# Patient Record
Sex: Female | Born: 1975 | Race: White | Hispanic: No | Marital: Single
Health system: Midwestern US, Community
[De-identification: ages and names within clinical notes are randomized; demographics above are authoritative.]

## PROBLEM LIST (undated history)

## (undated) DIAGNOSIS — Z1231 Encounter for screening mammogram for malignant neoplasm of breast: Principal | ICD-10-CM

## (undated) DIAGNOSIS — N840 Polyp of corpus uteri: Secondary | ICD-10-CM

## (undated) DIAGNOSIS — Z1239 Encounter for other screening for malignant neoplasm of breast: Secondary | ICD-10-CM

## (undated) MED ORDER — IBUPROFEN 800 MG TAB
800 mg | ORAL_TABLET | Freq: Three times a day (TID) | ORAL | Status: DC | PRN
Start: ? — End: 2017-08-11

## (undated) MED ORDER — DOCUSATE SODIUM 100 MG CAP
100 mg | ORAL_CAPSULE | Freq: Every day | ORAL | Status: AC
Start: ? — End: 2013-06-11

## (undated) MED ORDER — OXYCODONE-ACETAMINOPHEN 5 MG-325 MG TAB
5-325 mg | ORAL_TABLET | Freq: Four times a day (QID) | ORAL | Status: DC | PRN
Start: ? — End: 2017-08-11

---

## 2009-03-05 LAB — METABOLIC PANEL, COMPREHENSIVE
A-G Ratio: 1.4 (ref 0.8–1.7)
ALT (SGPT): 45 U/L (ref 30–65)
AST (SGOT): 19 U/L (ref 15–37)
Albumin: 4.1 g/dL (ref 3.4–5.0)
Alk. phosphatase: 36 U/L — ABNORMAL LOW (ref 50–136)
Anion gap: 7 mmol/L (ref 5–15)
BUN/Creatinine ratio: 20 (ref 12–20)
BUN: 18 MG/DL (ref 7–18)
Bilirubin, total: 0.4 MG/DL (ref 0.1–0.9)
CO2: 29 MMOL/L (ref 21–32)
Calcium: 9.2 MG/DL (ref 8.4–10.4)
Chloride: 107 MMOL/L (ref 100–108)
Creatinine: 0.9 MG/DL (ref 0.6–1.3)
GFR est AA: >60 mL/min/{1.73_m2} (ref >60)
GFR est non-AA: >60 mL/min/{1.73_m2} (ref >60)
Globulin: 3 g/dL (ref 2.0–4.0)
Glucose: 103 MG/DL — ABNORMAL HIGH (ref 74–99)
Potassium: 5 MMOL/L (ref 3.5–5.5)
Protein, total: 7.1 g/dL (ref 6.4–8.2)
Sodium: 143 MMOL/L (ref 136–145)

## 2009-03-05 LAB — LIPID PANEL
CHOL/HDL Ratio: 2.4 (ref 0–5.0)
Cholesterol, total: 181 MG/DL (ref 0–200)
HDL Cholesterol: 75 MG/DL — ABNORMAL HIGH (ref 40–60)
LDL, calculated: 96.6 MG/DL (ref 0–100)
LDL/HDL Ratio: 1.3
Triglyceride: 47 MG/DL (ref 0–150)
VLDL, calculated: 9.4 MG/DL

## 2011-12-23 ENCOUNTER — Inpatient Hospital Stay: Payer: PRIVATE HEALTH INSURANCE

## 2011-12-23 LAB — HCG URINE, QL. - POC: Pregnancy test,urine (POC): NEGATIVE

## 2011-12-23 MED ORDER — DOXYCYCLINE HYCLATE 100 MG CAP
100 mg | ORAL_CAPSULE | Freq: Two times a day (BID) | ORAL | Status: AC
Start: 2011-12-23 — End: 2012-01-02

## 2011-12-23 MED ADMIN — famotidine (PEPCID) tablet 20 mg: ORAL | @ 12:00:00 | NDC 68084017211

## 2011-12-23 MED ADMIN — cefoTEtan (CEFOTAN) 1 g in 0.9% sodium chloride (MBP/ADV) 50 mL MBP: INTRAVENOUS | @ 13:00:00 | NDC 63323038510

## 2011-12-23 MED ADMIN — ketorolac (TORADOL) injection 30 mg: INTRAVENOUS | @ 14:00:00 | NDC 00409379501

## 2011-12-23 MED ADMIN — diphenhydrAMINE (BENADRYL) 50 mg/mL injection: INTRAVENOUS | @ 15:00:00 | NDC 63323066401

## 2011-12-23 MED ADMIN — HYDROmorphone (PF) (DILAUDID) injection 0.2 mg: INTRAVENOUS | @ 14:00:00 | NDC 00409335601

## 2011-12-23 MED ADMIN — metroNIDAZOLE (FLAGYL) IVPB premix 500 mg: INTRAVENOUS | @ 13:00:00 | NDC 00409781124

## 2011-12-23 NOTE — Other (Signed)
TRANSFER - OUT REPORT:    Verbal report given to Vallery Ridge RN on Vickie Martin  being transferred to Phase II for routine post - op       Report consisted of patient???s Situation, Background, Assessment and   Recommendations(SBAR).     Information from the following report(s) SBAR was reviewed with the receiving nurse.    Opportunity for questions and clarification was provided.

## 2011-12-23 NOTE — Op Note (Signed)
[facility]                               [address] [place], [place] [address]                                 OPERATIVE REPORT    PATIENT:     Vickie Martin, Vickie Martin  MRN:            [id number]    DATE:   12/23/2011  BILLING:     [id number]  ROOM:        PACUPACUPL  ATTENDING:   Leta Jungling, MD  DICTATING:   Leta Jungling, MD      SURGEON: Dr. Leta Jungling, MD    ANESTHESIA: General anesthesia.    PREOPERATIVE DIAGNOSES  1. Lower abdominal pain.  2. Dysmenorrhea, and cramping.  3. Thick endometrium with endometrial mass.    POSTOPERATIVE DIAGNOSES  1. Lower abdominal pain.  2. Dysmenorrhea, and cramping.  3. Thick endometrium with endometrial mass.  4. Endometrial polyp and cyst.  5. Submucosal fibroid.    PROCEDURE PERFORMED  1. Hysteroscopy.  2. Hysteroscopic myomectomy.  3. Endometrial polypectomy.  4. Fractional dilatation and curettage.    COMPLICATIONS: None.    BLOOD LOSS: Minimum.    OPERATIVE FINDINGS: At the time of surgery, examination under anesthesia  revealed normal vulva and vagina. Cervix is closed. Uterus normal in size.  Adnexa felt to be clear. The uterus sounded to a depth of 7.5 cm.    Hysteroscopic evaluation of the uterus revealed endometrial polyps seen in  the lower uterine segment and submucosal fibroid seen in the midportion of  the uterus. The fundus appeared to be normal. Both tubal ostia were seen,  appeared to be normal. More appeared to be more patent on the left side.  A slightly thickened endometrium was noted. The cavity itself appeared to  be regular, apart from the fibroid, with no evidence of anomalies.    DESCRIPTION OF PROCEDURE: Under effective general anesthesia, the patient  in the lithotomy position, she was prepped and draped in appropriate  fashion. Examination under anesthesia revealed the above described  findings. Timeout was called and we agreed with the planned procedure. The   patient received preoperative antibiotics. The posterior vaginal wall was  retracted with a right-angle retractor. The cervix was grasped anteriorly  with single-tooth tenaculum. Endocervical curettage was performed after the  cervix was dilated to Hegar 6 dilator. Using 2-3 mm flexible hysteroscope  with lactated Ringer's for distention, evaluation of the endometrial cavity  revealed the above-described findings. The hysteroscope was removed. The  polyp was removed. The cavity was systematically curetted until felt to be  clear. The fibroid was felt and was dislodged. We did repeated  hysteroscopic evaluation to locate the fibroid and then to retrieve it,  which was done uneventfully.    At this point, evaluation and the curettage revealed that the endometrial  cavity is now clear and regular. The instruments were then removed from the  vagina. Tenaculum site was hemostatic. The cervix was not traumatized. The  vagina was then irrigated with hydrogen peroxide. The vulva was cleaned  with sterile water.    The patient tolerated the  procedure very well, was taken to recovery room  in good condition. Sponge, needle, and instrument counts were correct.  Estimated blood loss from procedure is 10 mL. No complications were  encountered with surgery or with anesthesia.                                Leta Jungling, MD    BA:wmx  D: 12/23/2011 T: 12/23/2011  9:18 A  Job: [id number]  CScriptDoc #: [id number]    cc:     Leta Jungling, MD          Kreg Shropshire, MD

## 2011-12-23 NOTE — H&P (Signed)
Preoperative evaluation  Met with patient and partner  H&P was reviewed and no changes  Planned surgery   Discussed with patient  All Their questions were answered  Home meds reviewed as follows:    Prior to Admission medications    Not on File       Visit Vitals   Item Reading   ??? BP 111/76   ??? Pulse 90   ??? Temp 98.1 ??F (36.7 ??C)   ??? Resp 16   ??? Ht 5\' 5"  (1.651 m)   ??? Wt 64.411 kg (142 lb)   ??? BMI 23.63 kg/m2   ??? SpO2 100%

## 2011-12-23 NOTE — Brief Op Note (Signed)
BRIEF OPERATIVE NOTE    Date of Procedure: 12/23/2011   Preoperative Diagnosis: ENDOMETRIAL POLYP  Postoperative Diagnosis: ENDOMETRIAL POLYP. submucosal fibroid   Procedure: Procedure(s) with comments:  DILATATION AND CURETTAGE HYSTEROSCOPY - DILATATION AND CURETTAGE HYSTEROSCOPY   Hysteroscopic myomectomy. Endometrial polypectomy  Surgeon(s) and Role:     * Leta Jungling, MD - Primary  Anesthesia: General   Estimated Blood Loss: 10 ml  Specimens:   ID Type Source Tests Collected by Time Destination   1 : Mucosal Fibroid Preservative Uterus  Leta Jungling, MD 12/23/2011 [phone] Pathology   2 : Uterus EMC Preservative Uterus  Leta Jungling, MD 12/23/2011 0830 Pathology   3 : Uterus EMC Preservative Uterus  Leta Jungling, MD 12/23/2011 0830 Pathology      Findings: uterus sound 7.5 cm. Endometrial polyps and submucosal fibroid   Complications: none  Implants: * No implants in log *

## 2011-12-23 NOTE — Procedures (Signed)
Post-Anesthesia Evaluation & Assessment    Visit Vitals   Item Reading   ??? BP 117/68   ??? Pulse 97   ??? Temp 98.6 ??F (37 ??C)   ??? Resp 14   ??? Ht 5' 5" (1.651 m)   ??? Wt 64.411 kg (142 lb)   ??? BMI 23.63 kg/m2   ??? SpO2 99%       Nausea/Vomiting: no nausea    Post-operative hydration adequate.    Pain score (VAS):     Mental status & Level of consciousness: alert and oriented x 3    Neurological status: moves all extremities, sensation grossly intact    Pulmonary status: airway patent, supplemental oxygen required no. none    Complications related to anesthesia: none    Patient has met all discharge requirements.    Additional comments: none    Vickie Ismael H Marsha Gundlach, MD  December 23, 2011

## 2011-12-23 NOTE — Op Note (Signed)
[facility]                               [address] [place], [place] [address]                                 OPERATIVE REPORT    PATIENT:     Vickie Martin, Vickie Martin  MRN:            [id number]    DATE:   12/23/2011  BILLING:     [id number]  ROOM:        PACUPACUPL  ATTENDING:   Leta Jungling, MD  DICTATING:   Leta Jungling, MD      SURGEON: Dr. Leta Jungling, MD    ANESTHESIA: General anesthesia.    PREOPERATIVE DIAGNOSES  1. Lower abdominal pain.  2. Dysmenorrhea, and cramping.  3. Thick endometrium with endometrial mass.    POSTOPERATIVE DIAGNOSES  1. Lower abdominal pain.  2. Dysmenorrhea, and cramping.  3. Thick endometrium with endometrial mass.  4. Endometrial polyp and cyst.  5. Submucosal fibroid.    PROCEDURE PERFORMED  1. Hysteroscopy.  2. Hysteroscopic myomectomy.  3. Endometrial polypectomy.  4. Fractional dilatation and curettage.    COMPLICATIONS: None.    BLOOD LOSS: Minimum.    OPERATIVE FINDINGS: At the time of surgery, examination under anesthesia  revealed normal vulva and vagina. Cervix is closed. Uterus normal in size.  Adnexa felt to be clear. The uterus sounded to a depth of 7.5 cm.    Hysteroscopic evaluation of the uterus revealed endometrial polyps seen in  the lower uterine segment and submucosal fibroid seen in the midportion of  the uterus. The fundus appeared to be normal. Both tubal ostia were seen,  appeared to be normal. More appeared to be more patent on the left side.  A slightly thickened endometrium was noted. The cavity itself appeared to  be regular, apart from the fibroid, with no evidence of anomalies.    DESCRIPTION OF PROCEDURE: Under effective general anesthesia, the patient  in the lithotomy position, she was prepped and draped in appropriate  fashion. Examination under anesthesia revealed the above described  findings. Timeout was called and we agreed with the planned procedure. The  patient received  preoperative antibiotics. The posterior vaginal wall was  retracted with a right-angle retractor. The cervix was grasped anteriorly  with single-tooth tenaculum. Endocervical curettage was performed after the  cervix was dilated to Hegar 6 dilator. Using 2-3 mm flexible hysteroscope  with lactated Ringer's for distention, evaluation of the endometrial cavity  revealed the above-described findings. The hysteroscope was removed. The  polyp was removed. The cavity was systematically curetted until felt to be  clear. The fibroid was felt and was dislodged. We did repeated  hysteroscopic evaluation to locate the fibroid and then to retrieve it,  which was done uneventfully.    At this point, evaluation and the curettage revealed that the endometrial  cavity is now clear and regular. The instruments were then removed from the  vagina. Tenaculum site was hemostatic. The cervix was not traumatized. The  vagina was then irrigated with hydrogen peroxide. The vulva was cleaned  with sterile water.    The patient tolerated the  procedure very well, was taken to recovery room  in good condition. Sponge, needle, and instrument counts were correct.  Estimated blood loss from procedure is 10 mL. No complications were  encountered with surgery or with anesthesia.                                Leta Jungling, MD    BA:wmx  D: 12/23/2011 T: 12/23/2011  9:18 A  Job: [id number]  CScriptDoc #: [id number]    cc:     Leta Jungling, MD          Kreg Shropshire, MD

## 2011-12-23 NOTE — Procedures (Signed)
Post-Anesthesia Evaluation & Assessment    Visit Vitals   Item Reading   ??? BP 117/68   ??? Pulse 97   ??? Temp 98.6 ??F (37 ??C)   ??? Resp 14   ??? Ht 5\' 5"  (1.651 m)   ??? Wt 64.411 kg (142 lb)   ??? BMI 23.63 kg/m2   ??? SpO2 99%       Nausea/Vomiting: no nausea    Post-operative hydration adequate.    Pain score (VAS):     Mental status & Level of consciousness: alert and oriented x 3    Neurological status: moves all extremities, sensation grossly intact    Pulmonary status: airway patent, supplemental oxygen required no. none    Complications related to anesthesia: none    Patient has met all discharge requirements.    Additional comments: none    Pola Corn, MD  December 23, 2011

## 2012-09-28 LAB — HEP B SURFACE AG: HBsAg, External: NEGATIVE

## 2012-09-28 LAB — HIV-1 AB WESTERN BLOT CONFIRM ONLY

## 2012-09-28 LAB — RUBELLA AB, IGM: Rubella, External: IMMUNE

## 2012-09-28 LAB — BLOOD TYPE, (ABO+RH)
ABO,Rh: O POS
TYPE, ABO & RH, EXTERNAL: O POS

## 2012-09-28 LAB — RPR

## 2013-02-08 LAB — GYN RAPID GP B STREP: GrBStrep, External: NEGATIVE

## 2013-02-08 LAB — CHLAMYDIA DNA PROBE: Chlamydia, External: NEGATIVE

## 2013-02-08 LAB — N GONORRHOEAE, DNA PROBE: Gonorrhea, External: NEGATIVE

## 2013-03-11 ENCOUNTER — Inpatient Hospital Stay
Admit: 2013-03-11 | Discharge: 2013-03-13 | Disposition: A | Discharge status: Home / Self Care | Payer: PRIVATE HEALTH INSURANCE | Attending: Obstetrics & Gynecology | Admitting: Obstetrics & Gynecology

## 2013-03-11 LAB — CBC WITH AUTOMATED DIFF
ABS. BASOPHILS: 0 10*3/uL (ref 0.0–0.06)
ABS. EOSINOPHILS: 0 10*3/uL (ref 0.0–0.4)
ABS. LYMPHOCYTES: 1.6 10*3/uL (ref 0.9–3.6)
ABS. MONOCYTES: 0.8 10*3/uL (ref 0.05–1.2)
ABS. NEUTROPHILS: 12.8 10*3/uL — ABNORMAL HIGH (ref 1.8–8.0)
BASOPHILS: 0 % (ref 0–2)
EOSINOPHILS: 0 % (ref 0–5)
HCT: 35 % (ref 35.0–45.0)
HGB: 12 g/dL (ref 12.0–16.0)
LYMPHOCYTES: 10 % — ABNORMAL LOW (ref 21–52)
MCH: 31 PG (ref 24.0–34.0)
MCHC: 34.3 g/dL (ref 31.0–37.0)
MCV: 90.4 FL (ref 74.0–97.0)
MONOCYTES: 5 % (ref 3–10)
MPV: 10.4 FL (ref 9.2–11.8)
NEUTROPHILS: 85 % — ABNORMAL HIGH (ref 40–73)
PLATELET: 195 10*3/uL (ref 135–420)
RBC: 3.87 M/uL — ABNORMAL LOW (ref 4.20–5.30)
RDW: 14 % (ref 11.6–14.5)
WBC: 15.2 10*3/uL — ABNORMAL HIGH (ref 4.6–13.2)

## 2013-03-11 LAB — TYPE & SCREEN
ABO/Rh(D): O POS
Antibody screen: NEGATIVE

## 2013-03-11 LAB — TYPE AND SCREEN
ABO/Rh: O POS
Antibody Screen: NEGATIVE

## 2013-03-11 MED ADMIN — ondansetron (ZOFRAN) 4 mg/2 mL injection: @ 17:00:00 | NDC 00781301072

## 2013-03-11 MED ADMIN — lactated ringers bolus infusion 500 mL: INTRAVENOUS | @ 17:00:00 | NDC 00409795309

## 2013-03-11 MED ADMIN — oxytocin (PITOCIN) 20 units/1000 ml LR: INTRAVENOUS | @ 22:00:00 | NDC 61553076904

## 2013-03-11 MED ADMIN — lactated ringers infusion: INTRAVENOUS | @ 19:00:00 | NDC 00409795309

## 2013-03-11 MED ADMIN — ibuprofen (MOTRIN) 400 mg tablet: ORAL | @ 23:00:00 | NDC 00904585361

## 2013-03-11 MED ADMIN — butorphanol (STADOL) 1 mg/mL injection: INTRAVENOUS | @ 17:00:00 | NDC 00409162301

## 2013-03-11 NOTE — Progress Notes (Signed)
Unable to get temp, pt ate ice. Skin warm to touch.

## 2013-03-11 NOTE — Progress Notes (Signed)
Discussed vacuum

## 2013-03-11 NOTE — Progress Notes (Signed)
Pushing continues. Alnaif bedside.

## 2013-03-11 NOTE — H&P (Signed)
L&D note  Seen in office, had SROM at 8 am with thick meconium stained fluid, pea soup like  37 year old G1 at 39 weeks  EDC 03-15-13  BP 118/75   Pulse 80   Temp(Src) 98.2 ??F (36.8 ??C)   Resp 18   Ht 5\' 5"  (1.651 m)   Wt 79.379 kg (175 lb)   BMI 29.12 kg/m2  Uterus term size  Vertex, confirmed by [place] re very thick MSAF  SFH 37 cm  NST reactive  VE  cx 90% effaced  2 cm dilated  Vertex at -1  No membranes  +++ thick meconium stained amniotic fluid with particulate  patient is contracting every 4-5 minutes  Impresson SROM with thick MSAF  Plan admit to L&D  expectant management  Pit PRN  Epidural PRN

## 2013-03-11 NOTE — Progress Notes (Signed)
BF L 15 min. R 45 min. Emycin. Pitocin bolus infused.

## 2013-03-11 NOTE — Progress Notes (Signed)
Vac applied 45sec

## 2013-03-11 NOTE — Progress Notes (Signed)
Assumed care of 36YO G1P0 in for SROM. Laboring with ext monitors. LR bolus infusing to 20gauge catheter in L hand.  SVE by Griffith Citron, RN. 8-9cm.

## 2013-03-11 NOTE — Progress Notes (Signed)
Uncomfortable in perineum. No pain rating.

## 2013-03-11 NOTE — Other (Addendum)
37 year old G1 P0 admitted with SROM and thick meconium  Dr. Norlene Campbell notified and orders received                                                gG

## 2013-03-11 NOTE — Progress Notes (Addendum)
Repair in progress. 5ml mec stained fluid removed by delee

## 2013-03-11 NOTE — Progress Notes (Signed)
Newborn to nursery.

## 2013-03-11 NOTE — Progress Notes (Signed)
alnaif bedside

## 2013-03-11 NOTE — Other (Signed)
Patient is requesting pain medication.

## 2013-03-11 NOTE — Progress Notes (Signed)
alnaif ordered PIT

## 2013-03-11 NOTE — Other (Signed)
Patient ambulating in hallway

## 2013-03-11 NOTE — Progress Notes (Signed)
Maternal rate tracing by palpation.

## 2013-03-11 NOTE — Procedures (Signed)
Delivery Note    Obstetrician:  Bartley Vuolo, MD    Pre-Delivery Diagnosis: Term pregnancy, Spontaneous labor, SROM of Thick MSAF    Post-Delivery Diagnosis: Living newborn infant( Female)    GBS Status: Negative    Intrapartum Event: None    Procedure: Vacuum assisted delivery    Epidural: NO    Monitor:  Fetal Heart Tones - External and Uterine Contractions - External    Indications for instrumental delivery: prolonged second stage with failure to progress    Estimated Blood Loss:     Episiotomy: 3rd degree    Laceration(s):  3rd degree    Laceration(s) repair: YES    Presentation: Cephalic    Fetal Description: singleton    Comments:  2 pulls on KIWI vacuum with no POP Offs, position was ROA, at +2, fully dilated cervix    Fetal Position: Right Occiput Anterior    Birth Weight: 3420 (7,9 Lb)    Birth Length: pending    Apgar - One Minute: 9    Apgar - Five Minutes: 9    Umbilical Cord: 3 vessels present    Specimens: none           Complications:  none           Cord Blood Results:   Information for the patient's newborn:  Perrell, Baby Boy A [[id number]]     No results found for this basename: PCTABR, PCTDIG, BILI     Prenatal Labs:     Lab Results   Component Value Date/Time    ABO/Rh(D) O POS 03/11/2013  1:02 PM        Attending Attestation: I performed the procedure    Signed By:  Taneisha Fuson, MD     March 11, 2013

## 2013-03-11 NOTE — Progress Notes (Signed)
Pushing

## 2013-03-11 NOTE — Progress Notes (Signed)
Progress  Feels rectal pressure  NST is OK  FH in 125s  VE cx is a small rim, mainly anterior  vertex at +1/+2  No membranes  patient is without epidural or pit  Coping well received stadol earlier  Plan expectant management

## 2013-03-11 NOTE — Procedures (Signed)
Delivery Note    Obstetrician:  Leta Jungling, MD    Pre-Delivery Diagnosis: Term pregnancy, Spontaneous labor, SROM of Thick MSAF    Post-Delivery Diagnosis: Living newborn infant( Female)    GBS Status: Negative    Intrapartum Event: None    Procedure: Vacuum assisted delivery    Epidural: NO    Monitor:  Fetal Heart Tones - External and Uterine Contractions - External    Indications for instrumental delivery: prolonged second stage with failure to progress    Estimated Blood Loss:     Episiotomy: 3rd degree    Laceration(s):  3rd degree    Laceration(s) repair: YES    Presentation: Cephalic    Fetal Description: singleton    Comments:  2 pulls on KIWI vacuum with no POP Offs, position was ROA, at +2, fully dilated cervix    Fetal Position: Right Occiput Anterior    Birth Weight: 3420 (7,9 Lb)    Birth Length: pending    Apgar - One Minute: 9    Apgar - Five Minutes: 9    Umbilical Cord: 3 vessels present    Specimens: none           Complications:  none           Cord Blood Results:   Information for the patient's newborn:  Kandie, Keiper Boy A [[id number]]     No results found for this basename: PCTABR, PCTDIG, BILI     Prenatal Labs:     Lab Results   Component Value Date/Time    ABO/Rh(D) O POS 03/11/2013  1:02 PM        Attending Attestation: I performed the procedure    Signed By:  Leta Jungling, MD     March 11, 2013

## 2013-03-12 LAB — HEMATOCRIT: HCT: 28.4 % — ABNORMAL LOW (ref 35.0–45.0)

## 2013-03-12 LAB — HEMOGLOBIN: HGB: 9.7 g/dL — ABNORMAL LOW (ref 12.0–16.0)

## 2013-03-12 MED ADMIN — ibuprofen (MOTRIN) tablet 800 mg: ORAL | @ 12:00:00 | NDC 00904585361

## 2013-03-12 MED ADMIN — oxyCODONE-acetaminophen (PERCOCET) 5-325 mg per tablet 1-2 Tab: ORAL | @ 20:00:00 | NDC 68084035511

## 2013-03-12 MED ADMIN — oxyCODONE-acetaminophen (PERCOCET) 5-325 mg per tablet 1-2 Tab: ORAL | @ 16:00:00 | NDC 68084035511

## 2013-03-12 MED ADMIN — oxyCODONE-acetaminophen (PERCOCET) 5-325 mg per tablet 2 Tab: ORAL | @ 04:00:00 | NDC 68084035511

## 2013-03-12 NOTE — Other (Signed)
Bedside and Verbal shift change report given to Lita, RN (oncoming nurse) by Trisha, RN (offgoing nurse).  Report given with SBAR, Kardex and MAR.

## 2013-03-12 NOTE — Progress Notes (Signed)
Post-Partum Day Number 1 Progress Note    Patient doing well post-partum without significant complaint.  Voiding withour difficulty, normal lochia.    Postpartum Day 1 Vaginal Delivery    The patient feels well. The patient denies emotional concerns. Pain is well controlled with current medications. The baby is good, had mild hypoglycemia. Baby is feeding via breast. Urinary output is adequate. The patient is ambulating well. The patient {IS  tolerating a normal diet. She has not had a bowel movement.      Breastfeeding: Yes normal nipples    Vitals:  Patient Vitals for the past 8 hrs:   BP Temp Pulse Resp SpO2   03/12/13 1550 109/72 mmHg 98.5 ??F (36.9 ??C) 88 18 98 %   03/12/13 1129 114/73 mmHg 97.3 ??F (36.3 ??C) 80 18 98 %     Temp (24hrs), Avg:97.6 ??F (36.4 ??C), Min:97.1 ??F (36.2 ??C), Max:98.5 ??F (36.9 ??C)      Vital signs stable, afebrile.    Exam:  Patient is in good general condition.  Emotionally: appears to be stable  Pallor: No  Breasts: engorged NO, tender NO, nipple normal   Abdomen: soft, fundus firm at level of umbilicus, nontender  Perineum:laceration clean  lochia slight noted.  Lower extremities are negative for swelling, cords or tenderness.    Lab/Data Review:  All lab results for the last 24 hours reviewed.  Recent Results (from the past 12 hour(s))   HEMATOCRIT    Collection Time     03/12/13  6:13 AM       Result Value Range    HCT 28.4 (*) 35.0 - 45.0 %   HEMOGLOBIN    Collection Time     03/12/13  6:13 AM       Result Value Range    HGB 9.7 (*) 12.0 - 16.0 g/dL         Assessment and Plan:  Patient appears to be having uncomplicated post-partum course.  Continue routine perineal care and maternal education.  Plan discharge tomorrow if no problems occur.    Education:  Post partum instructions discussed  Post-Partum Exercise discussed   Breastfeeding discussed  Perineal Care discussed  Circumcision care discussed, handout given   impression PP # 1   Anemia  Plan Fe  supplement

## 2013-03-12 NOTE — Progress Notes (Signed)
Chaplain conducted an initial consultation and Spiritual Assessment for Vickie Martin, who is a 37 y.o.,female. Patient???s Primary Language is: [place].   According to the patient???s EMR Religious Affiliation is: [place].     The reason the Patient came to the hospital is: There are no active problems to display for this patient.       The Chaplain provided the following Interventions:  Initiated a relationship of care and support.   Explored issues of faith, belief, spirituality and religious/ritual needs while hospitalized.  Listened empathically.  Provided chaplaincy education.  Provided information about Spiritual Care Services.  Offered prayer and assurance of continued prayers on patient's behalf.   Chart reviewed.    The following outcomes were achieved:  Patient shared limited information about both their medical narrative and spiritual journey/beliefs.  Patient processed feeling about current hospitalization.  Patient expressed gratitude for chaplain's visit.    Assessment:  Patient does not have any religious/cultural needs that will affect patient???s preferences in health care.  There are no further spiritual or religious issues which require intervention at this time.     Plan:  Chaplains will continue to follow and will provide pastoral care on an as needed/requested basis.  Chaplain recommends bedside caregivers page chaplain on duty if patient shows signs of acute spiritual or emotional distress.      Loni Beckwith Book   Chaplain Resident  Spiritual Care Department  [phone]

## 2013-03-12 NOTE — Other (Signed)
Bedside and Verbal shift change report given to S. Amyre Segundo, RN (oncoming nurse) by L. Warren, RN (offgoing nurse).  Report given with SBAR, MAR and Recent Results.

## 2013-03-13 MED ADMIN — oxyCODONE-acetaminophen (PERCOCET) 5-325 mg per tablet 1-2 Tab: ORAL | @ 01:00:00 | NDC 68084035511

## 2013-03-13 MED ADMIN — ibuprofen (MOTRIN) tablet 800 mg: ORAL | @ 13:00:00 | NDC 00904585361

## 2013-03-13 MED ADMIN — ibuprofen (MOTRIN) tablet 800 mg: ORAL | @ 01:00:00 | NDC 00904585361

## 2013-03-13 MED ADMIN — docusate sodium (COLACE) capsule 100 mg: ORAL | @ 13:00:00 | NDC 62584068311

## 2013-03-13 MED ADMIN — ferrous sulfate tablet 325 mg: ORAL | @ 13:00:00

## 2013-03-13 NOTE — Progress Notes (Signed)
Bedside and Verbal shift change report given to Tracey G RN (oncoming nurse) by Shannon S RN (offgoing nurse).  Report given with SBAR, Kardex and MAR.

## 2013-03-13 NOTE — Progress Notes (Signed)
Dr. Norlene Campbell into see patient.

## 2013-03-13 NOTE — Discharge Summary (Signed)
PPD #2  Patient is feeling well. Good pain control.  Ambulating, voiding and eating well, no nausea or vomiting.  Breast feeding is going very well  On examination  Patient is in good general condition in no apparent distress  Vital signs are stable  Visit Vitals   Item Reading   ??? BP 94/52   ??? Pulse 73   ??? Temp 98 ??F (36.7 ??C)   ??? Resp 18   ??? Ht 5\' 5"  (1.651 m)   ??? Wt 79.379 kg (175 lb)   ??? BMI 29.12 kg/m2   ??? SpO2 96%       CBC   Lab Results   Component Value Date/Time    WBC 15.2* 03/11/2013  1:02 PM    RBC 3.87* 03/11/2013  1:02 PM    HCT 28.4* 03/12/2013  6:13 AM    MCV 90.4 03/11/2013  1:02 PM    MCH 31.0 03/11/2013  1:02 PM    MCHC 34.3 03/11/2013  1:02 PM    RDW 14.0 03/11/2013  1:02 PM        Basic Metabolic Profile   Lab Results   Component Value Date    NA 143 03/04/2009    CO2 29 03/04/2009    BUN 18 03/04/2009         Breast are not engorged, nipples are normal.  Abdomen soft not tender.  Uterus is firm below umbilicus  Perineum is clean  Normal, scant  PV loss  Legs are normal without tenderness, swelling, or redness  Impression  Post op day 2 post Vacuum delivery  Doing well  Plan D/C home.

## 2013-03-13 NOTE — Progress Notes (Signed)
Pt to be d/c home today.

## 2013-03-13 NOTE — Progress Notes (Signed)
I have reviewed discharge instructions with the patient.  The patient verbalized understanding.

## 2014-10-02 LAB — CBC WITH AUTOMATED DIFF
BASOPHILS: 0.2 % (ref 0–3)
EOSINOPHILS: 0.6 % (ref 0–5)
HCT: 34.2 % — ABNORMAL LOW (ref 37.0–50.0)
HGB: 11.5 gm/dl — ABNORMAL LOW (ref 13.0–17.2)
IMMATURE GRANULOCYTES: 0.8 % (ref 0.0–3.0)
LYMPHOCYTES: 20.3 % — ABNORMAL LOW (ref 28–48)
MCH: 30.7 pg (ref 25.4–34.6)
MCHC: 33.6 gm/dl (ref 30.0–36.0)
MCV: 91.2 fL (ref 80.0–98.0)
MONOCYTES: 9.5 % (ref 1–13)
MPV: 9.9 fL (ref 6.0–10.0)
NEUTROPHILS: 68.6 % — ABNORMAL HIGH (ref 34–64)
NRBC: 0 (ref 0–0)
PLATELET: 184 10*3/uL (ref 140–450)
RBC: 3.75 M/uL (ref 3.60–5.20)
RDW-SD: 46.9 — ABNORMAL HIGH (ref 36.4–46.3)
WBC: 8.7 10*3/uL (ref 4.0–11.0)

## 2014-10-02 LAB — ANTIBODY SCREEN: Antibody screen: NEGATIVE

## 2014-10-02 LAB — BLOOD TYPE, (ABO+RH)
ABO/Rh(D): O POS
ABO/Rh: O POS

## 2014-10-03 LAB — POC BLOOD GAS
BASE EXCESS: -5 mmol/L — ABNORMAL LOW (ref <=3)
BICARBONATE: 20.1 mmol/L — ABNORMAL LOW (ref 22–26)
CO2, TOTAL: 21 mmol/L (ref 21–32)
O2 SAT: 67 %
PCO2: 34.5 mm Hg — ABNORMAL LOW (ref 35.0–45.0)
PO2: 35 mm Hg — CL (ref 80–100)
pH: 7.374 (ref 7.350–7.450)

## 2014-10-04 LAB — CBC W/O DIFF
HCT: 34 % — ABNORMAL LOW (ref 37.0–50.0)
HGB: 11.1 gm/dl — ABNORMAL LOW (ref 13.0–17.2)
MCH: 30.5 pg (ref 25.4–34.6)
MCHC: 32.6 gm/dl (ref 30.0–36.0)
MCV: 93.4 fL (ref 80.0–98.0)
MPV: 11 fL — ABNORMAL HIGH (ref 6.0–10.0)
PLATELET: 178 10*3/uL (ref 140–450)
RBC: 3.64 M/uL (ref 3.60–5.20)
RDW-SD: 49.8 — ABNORMAL HIGH (ref 36.4–46.3)
WBC: 14.2 10*3/uL — ABNORMAL HIGH (ref 4.0–11.0)

## 2016-10-17 ENCOUNTER — Encounter

## 2016-11-01 ENCOUNTER — Ambulatory Visit

## 2016-11-01 ENCOUNTER — Inpatient Hospital Stay: Admit: 2016-11-01 | Payer: PRIVATE HEALTH INSURANCE | Attending: Obstetrics & Gynecology | Primary: Internal Medicine

## 2016-11-01 DIAGNOSIS — Z1231 Encounter for screening mammogram for malignant neoplasm of breast: Secondary | ICD-10-CM

## 2017-08-11 ENCOUNTER — Inpatient Hospital Stay
Admit: 2017-08-11 | Discharge: 2017-08-11 | Disposition: A | Discharge status: Home / Self Care | Payer: BLUE CROSS/BLUE SHIELD | Attending: Emergency Medicine

## 2017-08-11 DIAGNOSIS — S91215A Laceration without foreign body of left lesser toe(s) with damage to nail, initial encounter: Secondary | ICD-10-CM

## 2017-08-11 NOTE — ED Provider Notes (Addendum)
EMERGENCY DEPARTMENT HISTORY AND PHYSICAL EXAM    7:19 PM      Date: 08/11/2017  Patient Name: Vickie Martin    History of Presenting Illness     Chief Complaint   Patient presents with   ??? Laceration         History Provided By: Patient    Chief Complaint: toe laceration   Duration:  Hours  Timing:  Acute  Location: left 5th toe   Quality: Burning  Severity: Mild  Modifying Factors:    Associated Symptoms: denies any other associated signs or symptoms      Additional History (Context): Vickie Martin is a 41 y.o. female with No significant past medical history who presents with left 5th toe laceration.  She thinks she kicked it on a rock and it cut her foot.  The toe nail feels loose.  Her last tetanus was within 5 years.  She denies pain.      PCP: Coralie Keens, MD    Current Outpatient Prescriptions   Medication Sig Dispense Refill   ??? levonorgestrel (MIRENA) 20 mcg/24 hr (5 years) IUD 1 Device by IntraUTERine route once.         Past History     Past Medical History:  History reviewed. No pertinent past medical history.    Past Surgical History:  Past Surgical History:   Procedure Laterality Date   ??? HX DILATION AND CURETTAGE     ??? HX OTHER SURGICAL      Breast augmentation   ??? IMPLANT BREAST SILICONE/EQ  2006       Family History:  History reviewed. No pertinent family history.    Social History:  Social History   Substance Use Topics   ??? Smoking status: Never Smoker   ??? Smokeless tobacco: None   ??? Alcohol use None       Allergies:  Allergies   Allergen Reactions   ??? Sulfa (Sulfonamide Antibiotics) Unable to Obtain         Review of Systems       Review of Systems   Constitutional: Negative for fever.   HENT: Negative for facial swelling.    Eyes: Negative for visual disturbance.   Respiratory: Negative for shortness of breath.    Cardiovascular: Negative for chest pain.   Gastrointestinal: Negative for abdominal pain.   Genitourinary: Negative for dysuria.    Musculoskeletal: Negative for neck pain.   Skin: Positive for wound. Negative for rash.   Neurological: Negative for dizziness.   Psychiatric/Behavioral: Negative for confusion.   All other systems reviewed and are negative.        Physical Exam     Visit Vitals   ??? BP 131/80 (BP 1 Location: Left arm, BP Patient Position: At rest)   ??? Pulse 75   ??? Temp 99.1 ??F (37.3 ??C)   ??? Resp 16   ??? Ht 5\' 4"  (1.626 m)   ??? Wt 63.5 kg (140 lb)   ??? LMP 10/19/2014   ??? SpO2 98%   ??? Breastfeeding No   ??? BMI 24.03 kg/m2         Physical Exam   Constitutional: She is oriented to person, place, and time. She appears well-developed and well-nourished. No distress.   HENT:   Head: Normocephalic and atraumatic.   Eyes: Conjunctivae are normal.   Neck: Normal range of motion.   Cardiovascular: Normal rate and regular rhythm.    Pulmonary/Chest: Effort normal.   Abdominal:  She exhibits no distension.   Musculoskeletal: Normal range of motion.   No boney tenderness    Neurological: She is alert and oriented to person, place, and time.   Skin: Skin is warm and dry. She is not diaphoretic.   Left 5th toenail is avulsed, only connected by lateral nail edge.  Small laceration to nailbed.  Superficial skin avulsion distal to the nail plate.     Psychiatric: She has a normal mood and affect.   Nursing note and vitals reviewed.        Diagnostic Study Results     Labs -  No results found for this or any previous visit (from the past 12 hour(s)).    Radiologic Studies -   No orders to display         Medical Decision Making   I am the first provider for this patient.    I reviewed the vital signs, available nursing notes, past medical history, past surgical history, family history and social history.    Vital Signs-Reviewed the patient's vital signs.      Records Reviewed: Nursing Notes (Time of Review: 7:19 PM)    ED Course: Progress Notes, Reevaluation, and Consults:      Provider Notes (Medical Decision Making): MDM   Number of Diagnoses or Management Options  Avulsion of toenail, initial encounter:   Laceration of nail bed of toe, initial encounter:   Diagnosis management comments: nailplate removed to expose a lacerated nailbed.  Wound was cleansed.  Nailbed was repaired with chromics.  Nail was replaced and sutured in position with ethilons.  No pain to suggest fracture so XR was deferred.  Tetanus is UTD>       REMOVAL OF NAIL PLATE  Date/Time: 08/11/2017 7:28 PM  Performed by: Janice Norrie, Jaydan Chretien A  Authorized by: Barton Fanny A     Consent:     Consent obtained:  Verbal    Consent given by:  Patient    Risks discussed:  Bleeding, incomplete removal, infection, pain and permanent nail deformity    Alternatives discussed:  Alternative treatment  Location:     Foot:  L little toe  Pre-procedure details:     Skin preparation:  Betadine  Anesthesia (see MAR for exact dosages):     Anesthesia method:  Nerve block    Block needle gauge:  27 G    Block anesthetic:  Lidocaine 1% w/o epi    Block technique:  Digital     Block injection procedure:  Anatomic landmarks identified, anatomic landmarks palpated, negative aspiration for blood, introduced needle and incremental injection    Block outcome:  Anesthesia achieved  Nail Removal:     Nail removed:  Complete    Nail bed repaired: yes      Nail bed repair material:  5-0 chromic gut    Number of sutures:  2    Removed nail replaced and anchored: yes    Post-procedure details:     Dressing:  4x4 sterile gauze    Patient tolerance of procedure:  Tolerated well, no immediate complications          Diagnosis     Clinical Impression:   1. Avulsion of toenail, initial encounter    2. Laceration of nail bed of toe, initial encounter        Disposition:     Follow-up Information     Follow up With Details Comments Contact Info    Coralie Keens, MD In 2 weeks  For suture removal [address] [facility] [place] [address]  [phone]       [facility]  Immediately if symptoms worsen [address] [place] [address]  [phone]           Patient's Medications   Start Taking    No medications on file   Continue Taking    LEVONORGESTREL (MIRENA) 20 MCG/24 HR (5 YEARS) IUD    1 Device by IntraUTERine route once.   These Medications have changed    No medications on file   Stop Taking    IBUPROFEN (MOTRIN) 800 MG TABLET    Take 1 Tab by mouth three (3) times daily as needed.    OXYCODONE-ACETAMINOPHEN (PERCOCET) 5-325 MG PER TABLET    Take 1 Tab by mouth every six (6) hours as needed.     _______________________________    Attestations:  Scribe Attestation     Kyler Germer A Lovinia Snare, PA-C acting as a Neurosurgeon for and in the presence of [employer], PA-C      August 11, 2017 at 7:56 PM       Provider Attestation:      I personally performed the services described in the documentation, reviewed the documentation, as recorded by the scribe in my presence, and it accurately and completely records my words and actions. August 11, 2017 at 7:56 PM - Barton Fanny, PA-C  _______________________________

## 2017-08-11 NOTE — ED Triage Notes (Signed)
Patient states injury to left 5th toe when she states striking it on a rock.  States tetanus is UTD possibly in last 5 years, definitely in last 10 years.

## 2017-08-11 NOTE — ED Notes (Signed)
I have reviewed discharge instructions with the patient.  The patient verbalized understanding.    Medication teaching given, to include name, dose, action, and side effects. Patient verbalized understanding of medications. Encouraged patient to voice any concerns with reassurance provided.      Patient armband removed and shredded    Patient Discharged in stable condition.

## 2017-10-27 ENCOUNTER — Encounter

## 2017-12-01 ENCOUNTER — Ambulatory Visit

## 2017-12-01 ENCOUNTER — Encounter

## 2017-12-01 ENCOUNTER — Inpatient Hospital Stay: Admit: 2017-12-01 | Payer: BLUE CROSS/BLUE SHIELD | Attending: Obstetrics & Gynecology | Primary: Internal Medicine

## 2017-12-01 DIAGNOSIS — Z1231 Encounter for screening mammogram for malignant neoplasm of breast: Secondary | ICD-10-CM

## 2018-11-30 ENCOUNTER — Encounter

## 2018-12-13 ENCOUNTER — Ambulatory Visit: Payer: PRIVATE HEALTH INSURANCE | Primary: Internal Medicine

## 2021-04-05 ENCOUNTER — Encounter

## 2021-04-23 ENCOUNTER — Inpatient Hospital Stay: Payer: BLUE CROSS/BLUE SHIELD | Attending: Obstetrics & Gynecology | Primary: Internal Medicine

## 2022-01-17 ENCOUNTER — Encounter

## 2022-01-21 ENCOUNTER — Encounter

## 2022-11-13 IMAGING — MR MRI LUMBAR SPINE WITHOUT CONTRAST
4 of 8 series · 15 of 48 positions shown · IV contrast (gadolinium)
Comparison: None

________________________________________________________________________________________________ 
MRI LUMBAR SPINE WITHOUT CONTRAST, 11/13/2022 [DATE]: 
CLINICAL INDICATION: Spondylosis, chronic low back pain extending to legs
TECHNIQUE: Sagittal T1, Sagittal T2, Sagittal STIR, Axial T1 and Axial T2 MR 
images of the lumbar spine were performed without intravenous gadolinium 
enhancement.

[Series 101: survey · axial · 10.0mm · 1.39mm/px · z∈[-15,+199]mm · 3 of 9 slices shown]
[im 1/9]
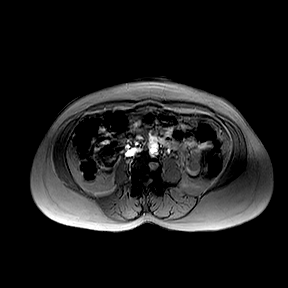
[im 6/9]
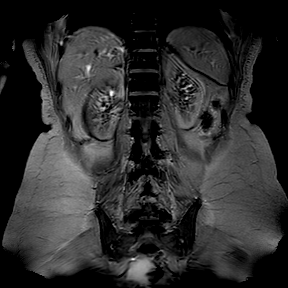
[im 9/9]
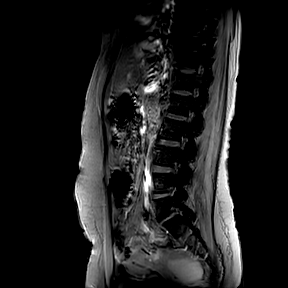

[Series 201: t2w_cor-surv · coronal · 6.0mm · 0.50mm/px · 3 of 7 slices shown]
[im 1/7]
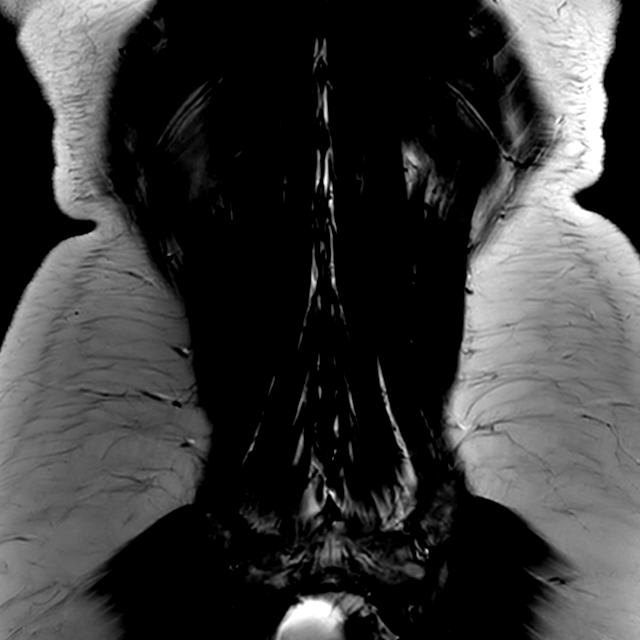
[im 4/7]
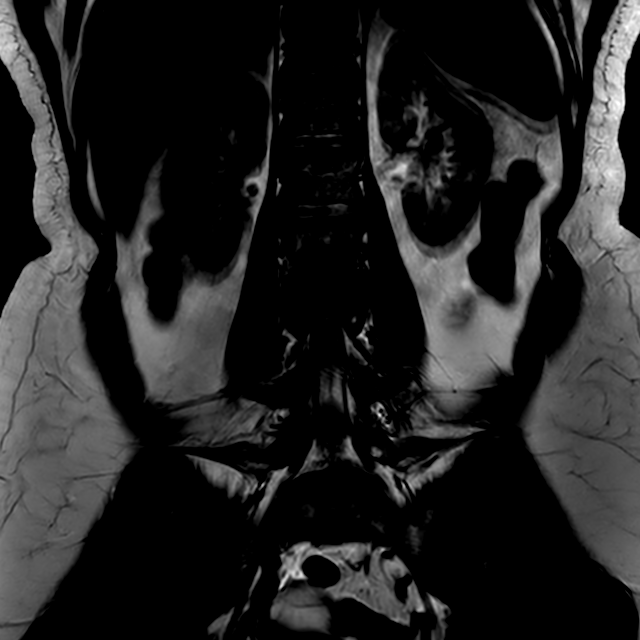
[im 7/7]
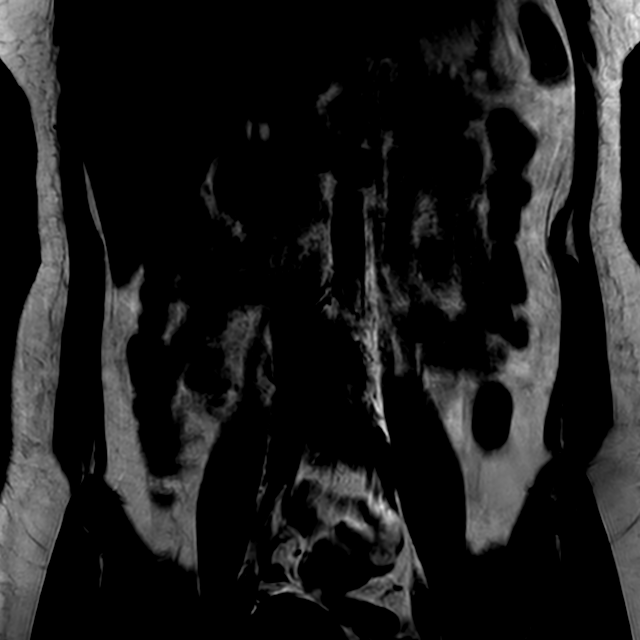

[Series 301: t1w_tse sag · sagittal · 4.0mm · 0.36mm/px · 3 of 17 slices shown]
[im 1/17]
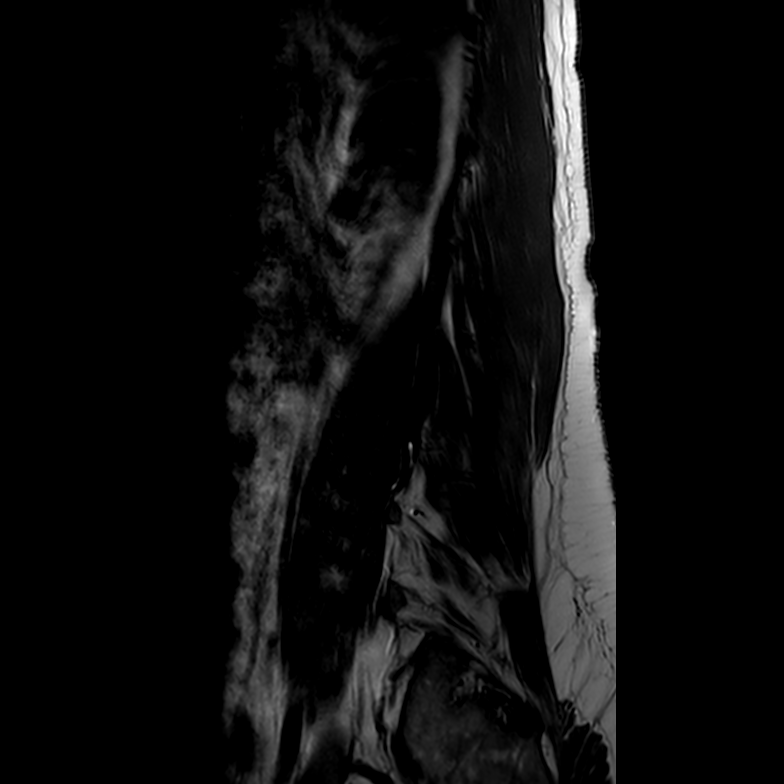
[im 9/17]
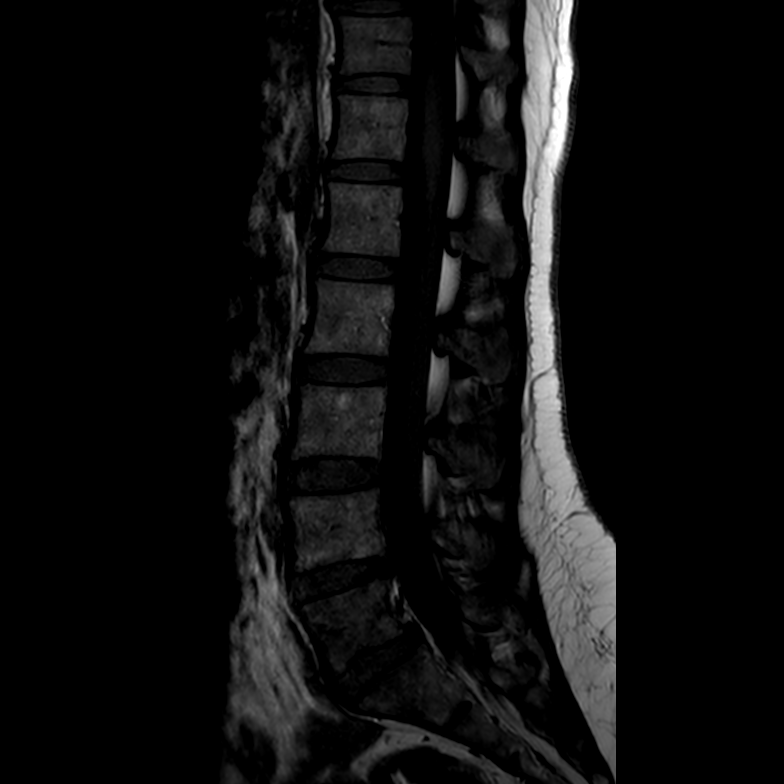
[im 17/17]
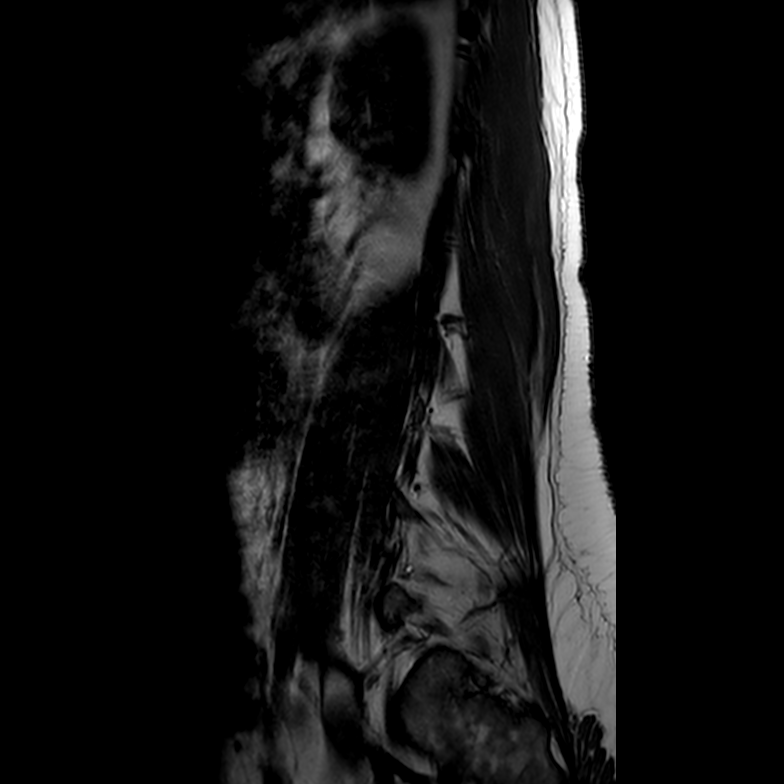

[Series 601: T1 · axial · 5.0mm · 0.35mm/px · z∈[-125,+73]mm · 6 of 38 slices shown]
[im 1/38]
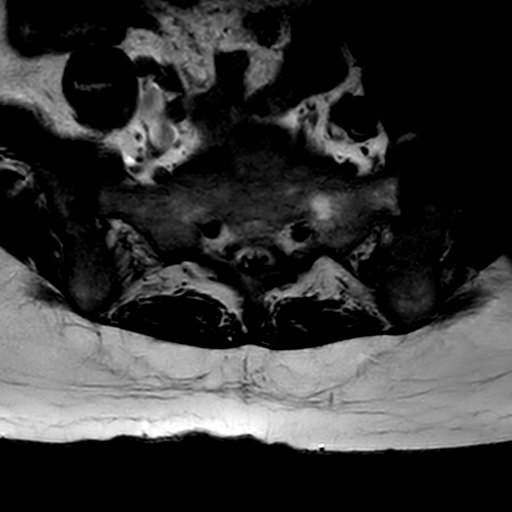
[im 7/38]
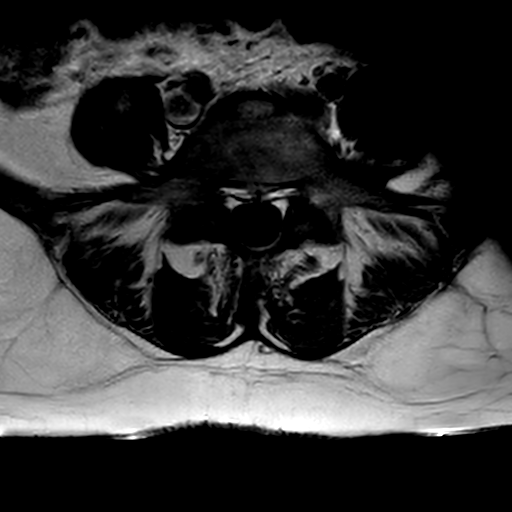
[im 11/38]
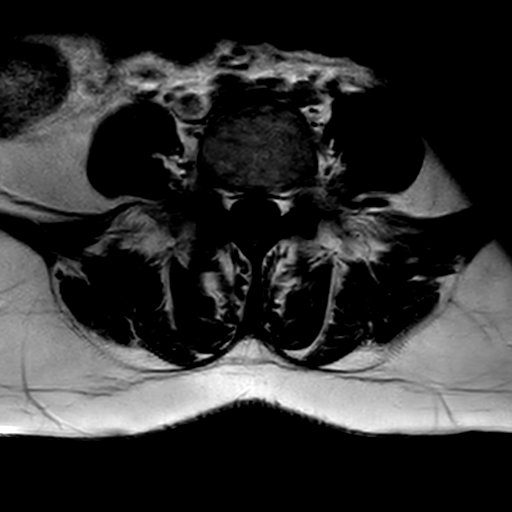
[im 17/38]
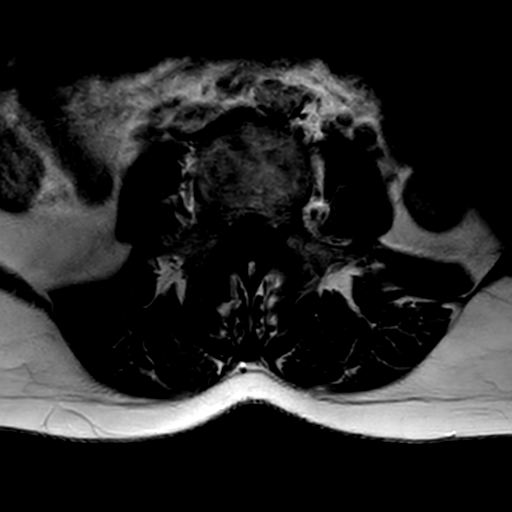
[im 21/38]
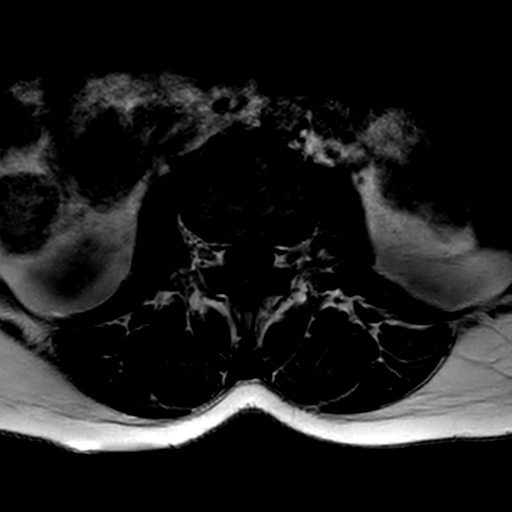
[im 34/38]
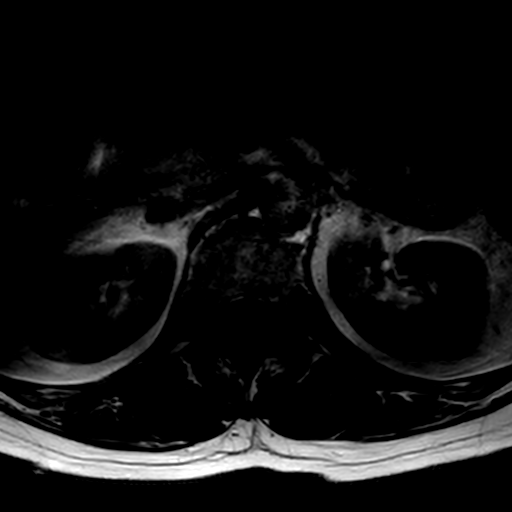

[15 of 48 positions shown; findings below may reference images not displayed]

FINDINGS: Lumbar vertebral heights are intact. There is 3 mm anterolisthesis at 
L5-S1. Mild motion artifact limits detail. Suspect bilateral L5 pars defects. 
At L5-S1 the canal is open. There is mild disc bulge encroaching on the ventral 
epidural fat without deformity of the thecal sac. There is mild bilateral 
foraminal stenosis. 
At L4-5 there is a small midline subligamentous protrusion, axial T2 image 7, 
sagittal T2 image 8. This measures approximately 3 x 2 mm. This produces slight 
deformity on the mid ventral thecal sac, without significant stenosis or neural 
impingement. Foramina are open. Mild facet degenerative change. 
At L3-4 the disc margin is intact. The canal and foramina are open. There are 
mild to moderate facet degenerative changes. 
At L2-3 the disc margin is intact and the canal and foramina are open. Mild 
facet change. 
At L1-2 the disc margin is intact and the canal and foramina are open. 
There is edema within the L5 vertebral segment, likely resulting from reactive 
endplate change at the L5-S1 level. No specific findings to indicate malignancy. 
No compression fracture. Small Schmorls node along the inferior L5 endplate is 
possibly of recent onset.
IMPRESSION: Small midline subligamentous protrusion at L5-S1 extends along the inferior 
Sharpeys fibers, slightly indenting the thecal sac in the midline . This is a 
potential source of discogenic pain. 
3 mm anterolisthesis of L5 on S1. Suspect bilateral L5 pars defects. Motion 
artifact limits detail. There is no significant canal stenosis. Mild bilateral 
foraminal narrowing. 
There is extensive Modic type I change at L5-S1. There is edema involving the 
lower two thirds of the L5 segment. There is a small Schmorls node along the 
inferior L5 endplate, possibly recent. No compression fracture.

## 2022-11-28 IMAGING — CT CT LUMBAR SPINE WITHOUT CONTRAST
2 series · 14 of 20 positions shown, 17 images · non-contrast
Comparison: MRI lumbar spine from November 13, 2022.

________________________________________________________________________________________________ 
******** ADDENDUM #1 ********/n 
3-D reconstructions performed on a dedicated independent workstation. 
CT LUMBAR SPINE WITHOUT CONTRAST, 11/28/2022 [DATE]: 
CLINICAL INDICATION: Chronic low back pain with radiation down bilateral legs. 
Prior MRI showed suspected L5 pars defects. 
A search for DICOM formatted images was conducted for prior CT imaging studies 
completed at a non-affiliated media free facility.
TECHNIQUE: The lumbar spine was scanned from T12 through mid-sacrum without 
contrast on a high-resolution CT scanner using dose reduction techniques.

[Series 3: axial · axial · 0.32mm/px · z∈[-349,-101]mm · 11 of 294 slices shown, 14 images]
[im 23/294  soft-tissue]
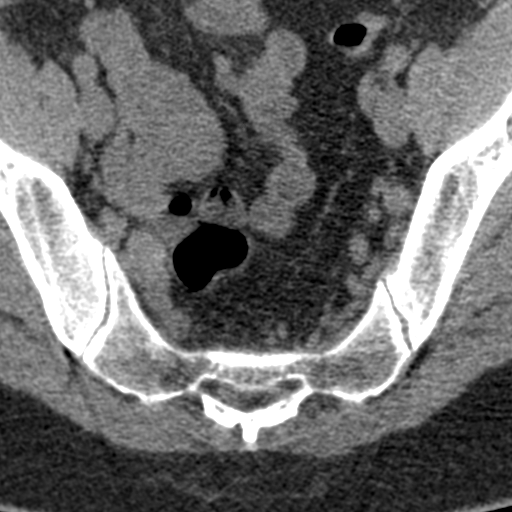
[im 23/294  bone]
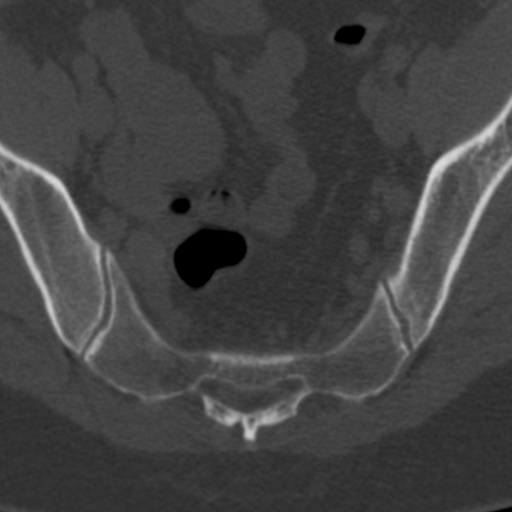
[im 46/294  bone]
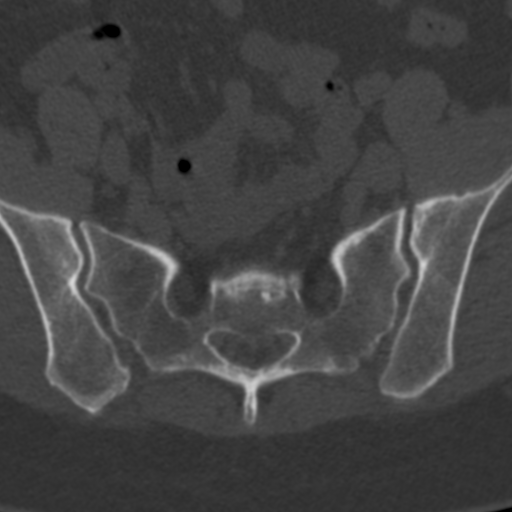
[im 68/294  bone]
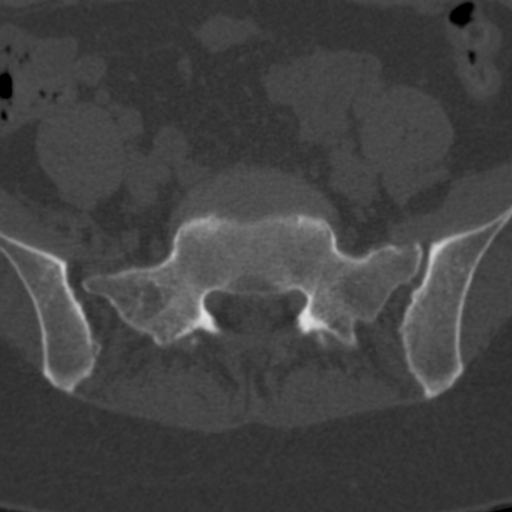
[im 91/294  bone]
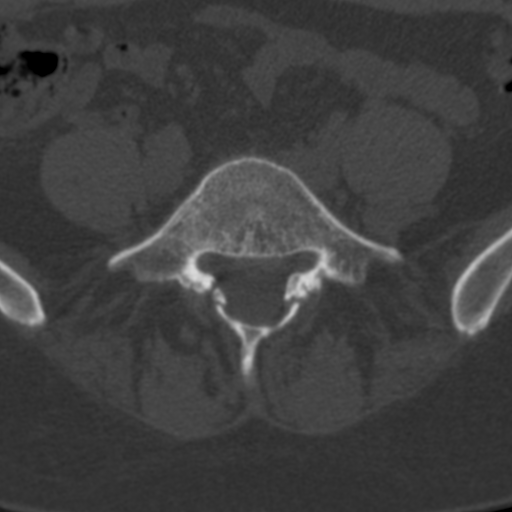
[im 113/294  soft-tissue]
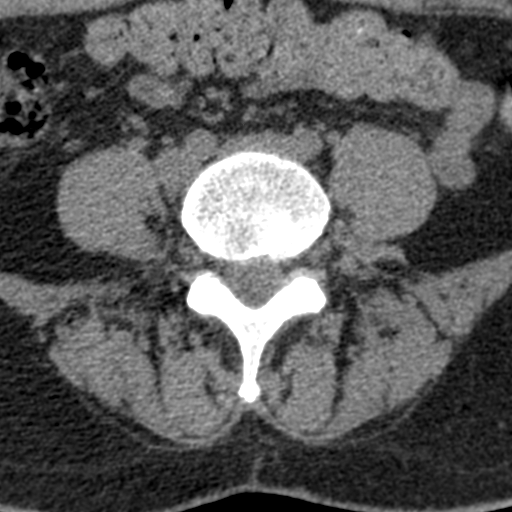
[im 113/294  bone]
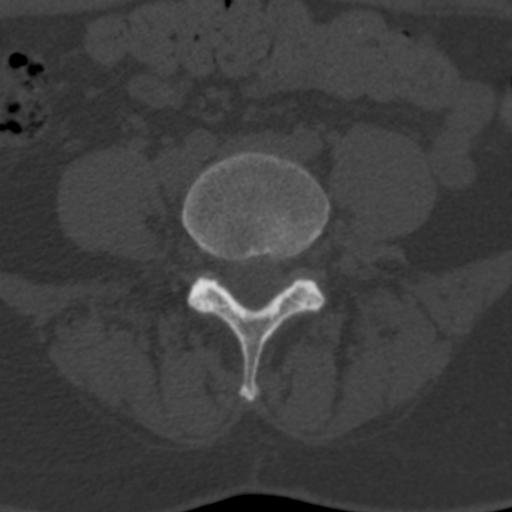
[im 158/294  bone]
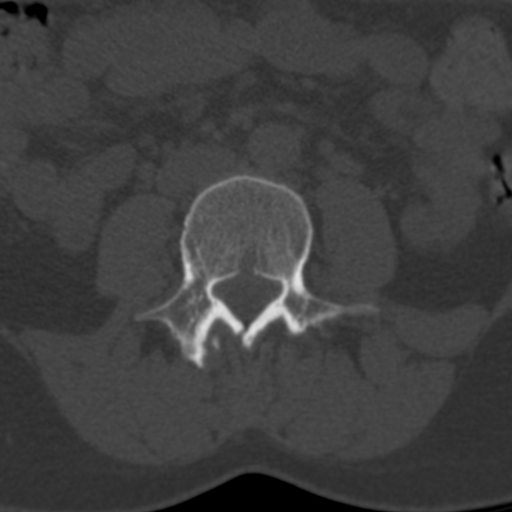
[im 181/294  bone]
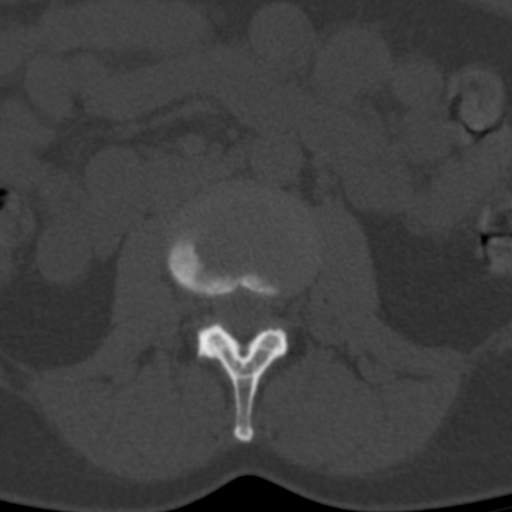
[im 203/294  bone]
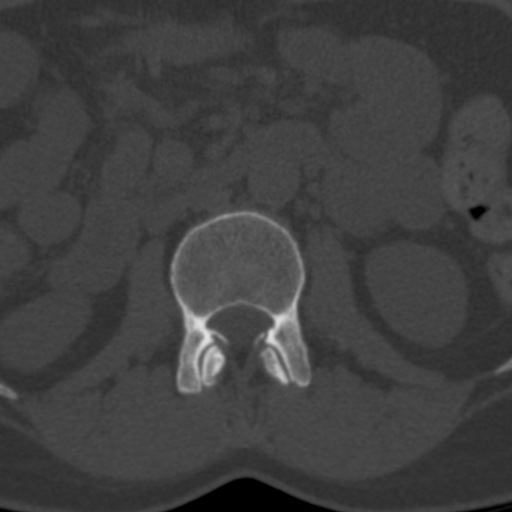
[im 226/294  soft-tissue]
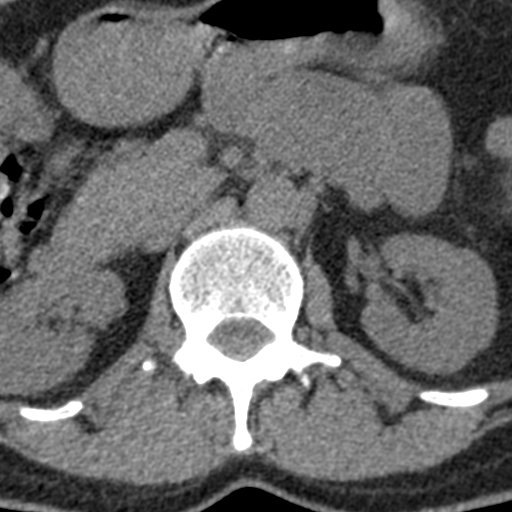
[im 226/294  bone]
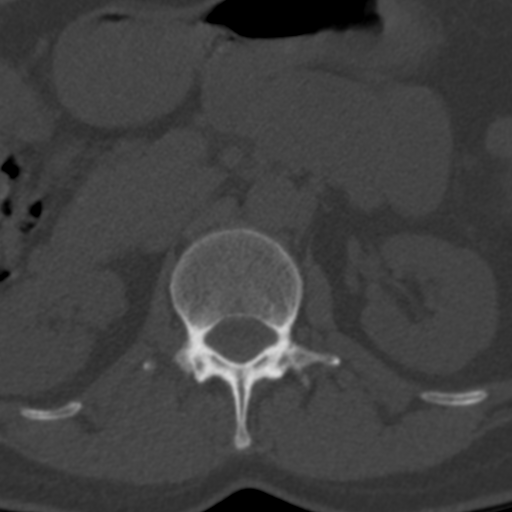
[im 248/294  bone]
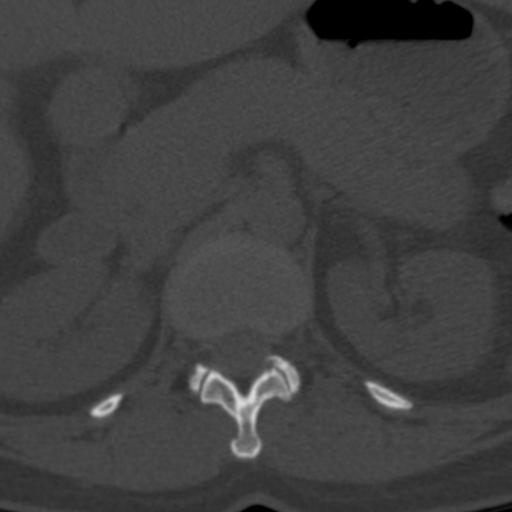
[im 271/294  bone]
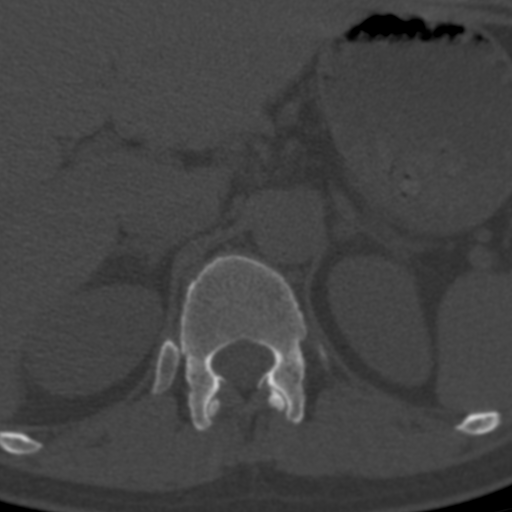

[Series 5: cor · coronal · 0.37mm/px · 3 of 150 slices shown]
[im 30/150  bone]
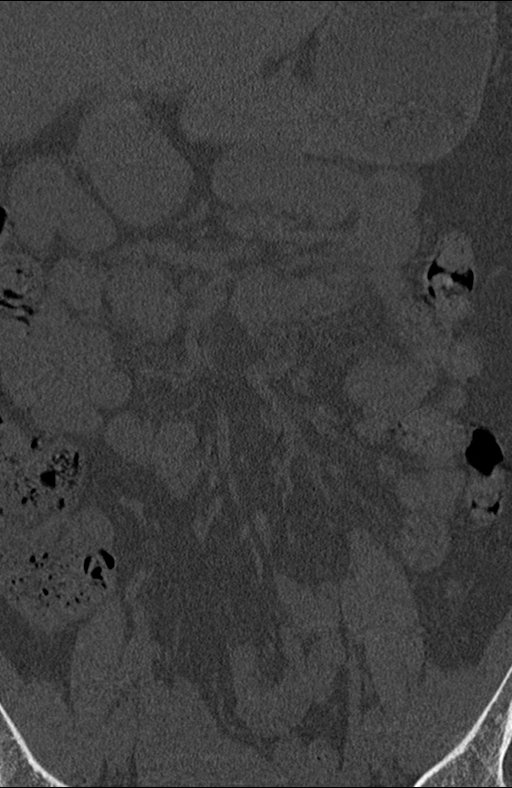
[im 60/150  bone]
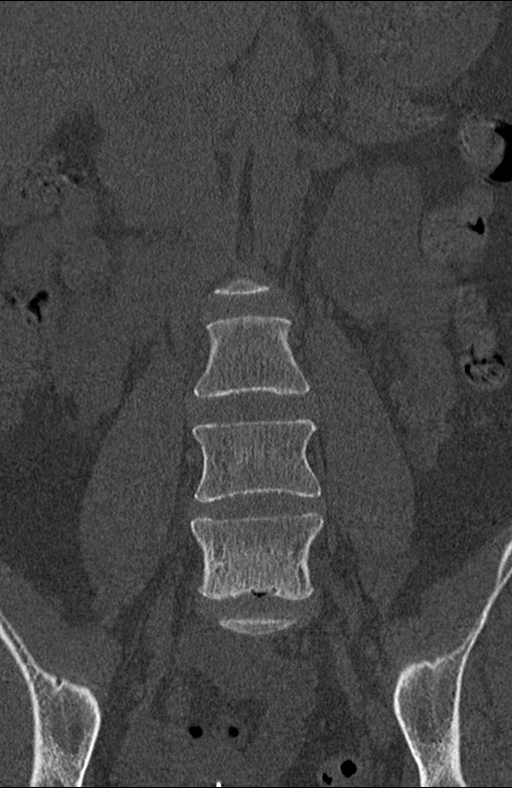
[im 90/150  bone]
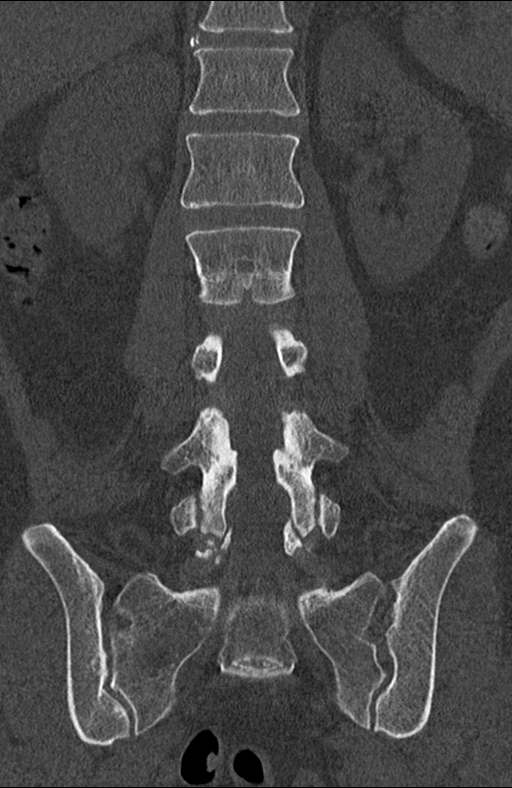

[14 of 20 positions shown; findings below may reference images not displayed]

FINDINGS: -------------------------------------------------------------------------------- 
------ 
Bilateral L5 spondylolysis is present with mild grade 1 anterolisthesis of L5 
relative to S1.  
No change in alignment compared to prior MRI. No focal suspect lytic or blastic 
lesions. Mild vacuum disc phenomenon at L5-S1. Visualized extraspinal soft 
tissues reveal intrauterine device in place. Nonobstructing left renal stones, 
largest in the lower pole measuring 3 mm. 
-------------------------------------------------------------------------------- 
------
IMPRESSION: Bilateral L5 spondylolysis with grade 1 spondylolisthesis. 

RADIATION DOSE REDUCTION: All CT scans are performed using radiation dose 
reduction techniques, when applicable.  Technical factors are evaluated and 
adjusted to ensure appropriate moderation of exposure.  Automated dose 
management technology is applied to adjust the radiation doses to minimize 
exposure while achieving diagnostic quality images.

## 2023-03-22 IMAGING — MG MAMMOGRAPHY SCREENING BILATERAL WITH IMPLANTS
8 of 12 series · 8 of 28 positions shown · non-contrast
Comparison: Comparison was made to prior examinations.

________________________________________________________________________________________________ 
MAMMOGRAPHY SCREENING BILATERAL WITH IMPLANTS, 03/22/2023 [DATE]: 
CLINICAL INDICATION: Encounter for screening mammogram..
TECHNIQUE: Digital mammogram and 3-D Tomosynthesis were obtained. These were 
interpreted both primarily and with the aid of computer-aided detection system. 
Because the patient has breast implants additional Bent views were performed. 
BREAST DENSITY: (Level C) The breasts are heterogeneously dense, which may 
obscure small masses.

[L MLO (1 of 2)]
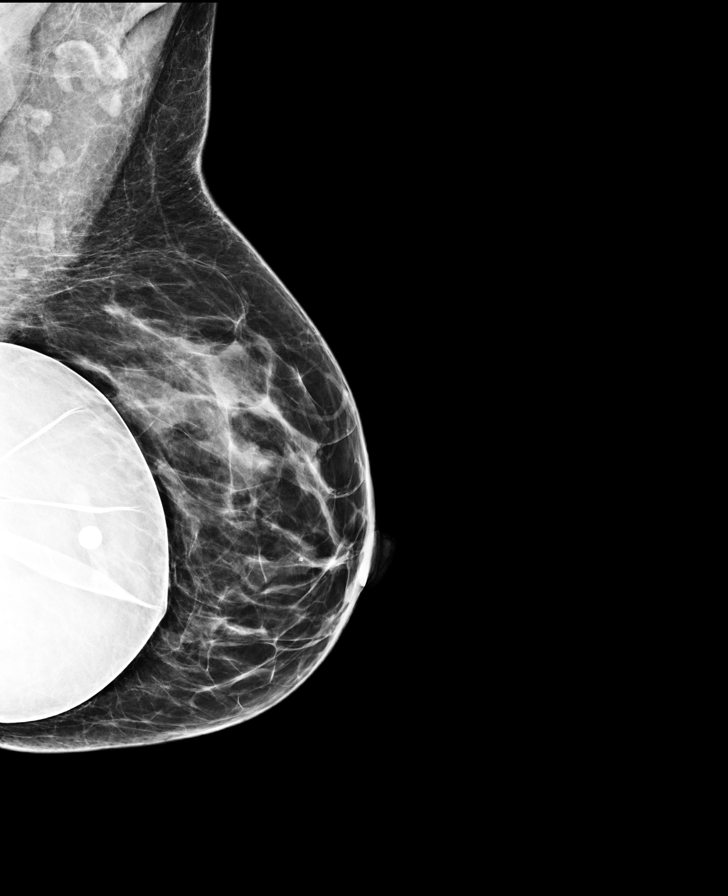

[R MLO (1 of 2)]
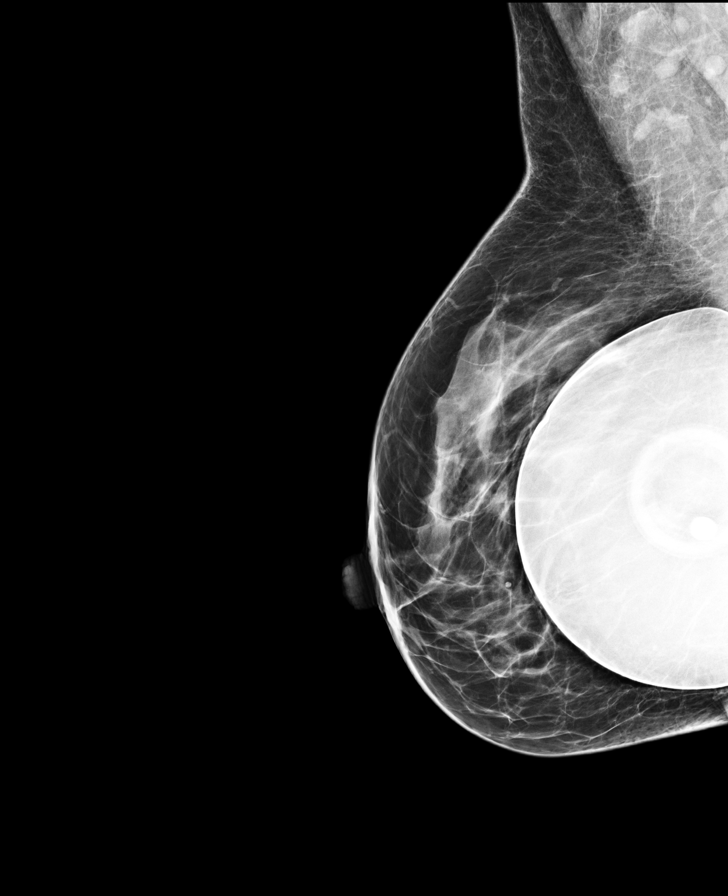

[L CC (1 of 2)]
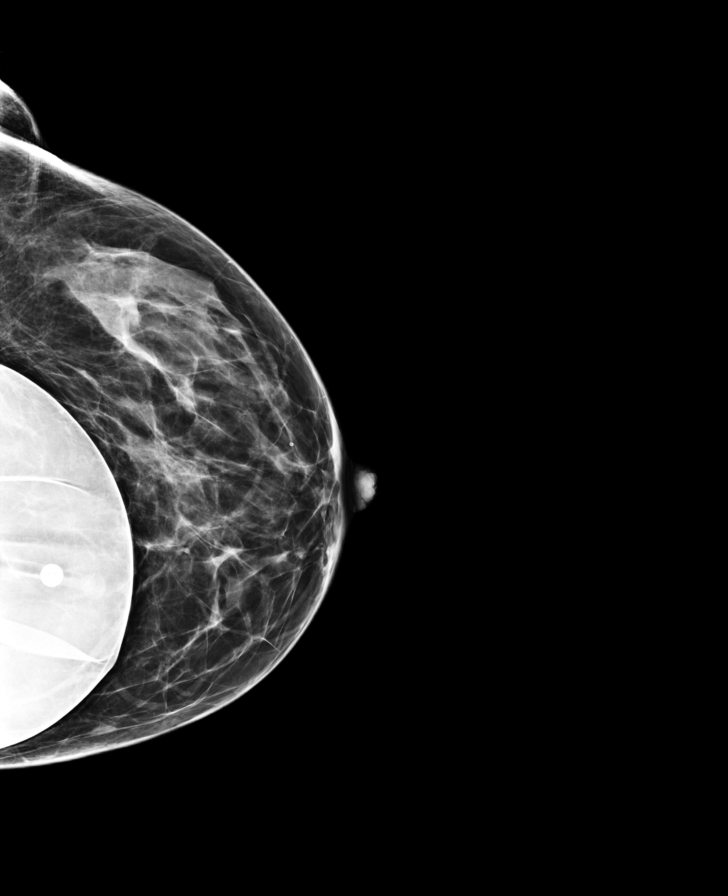

[R CC (1 of 2)]
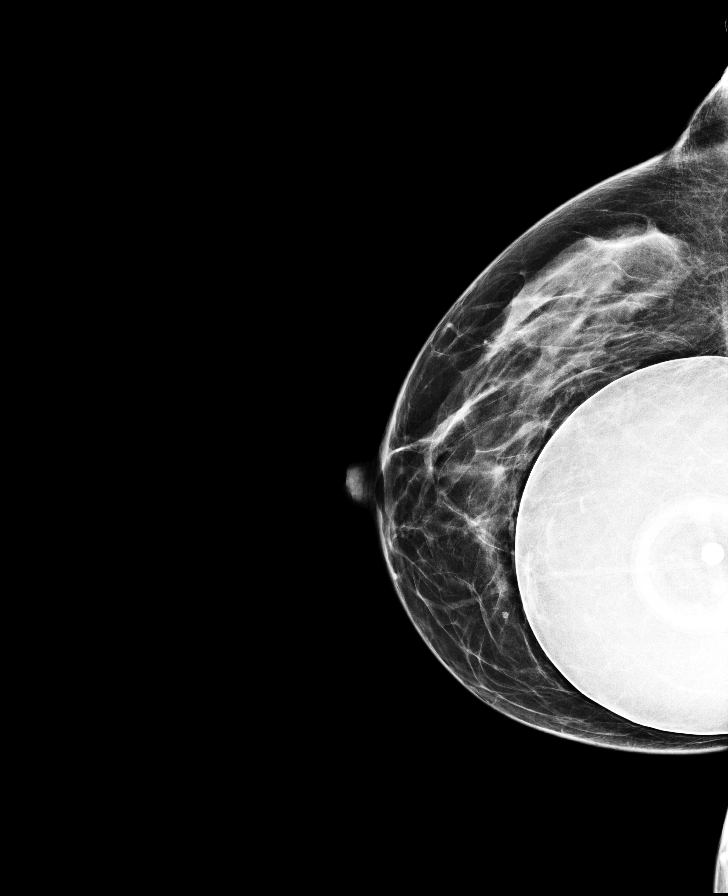

[R CC (2 of 2)]
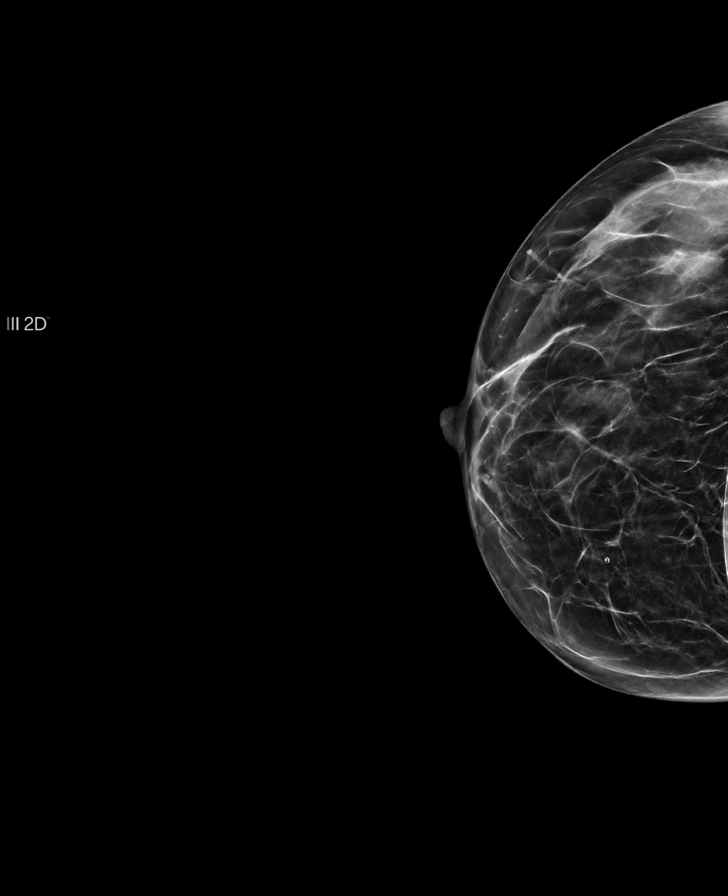

[L CC (2 of 2)]
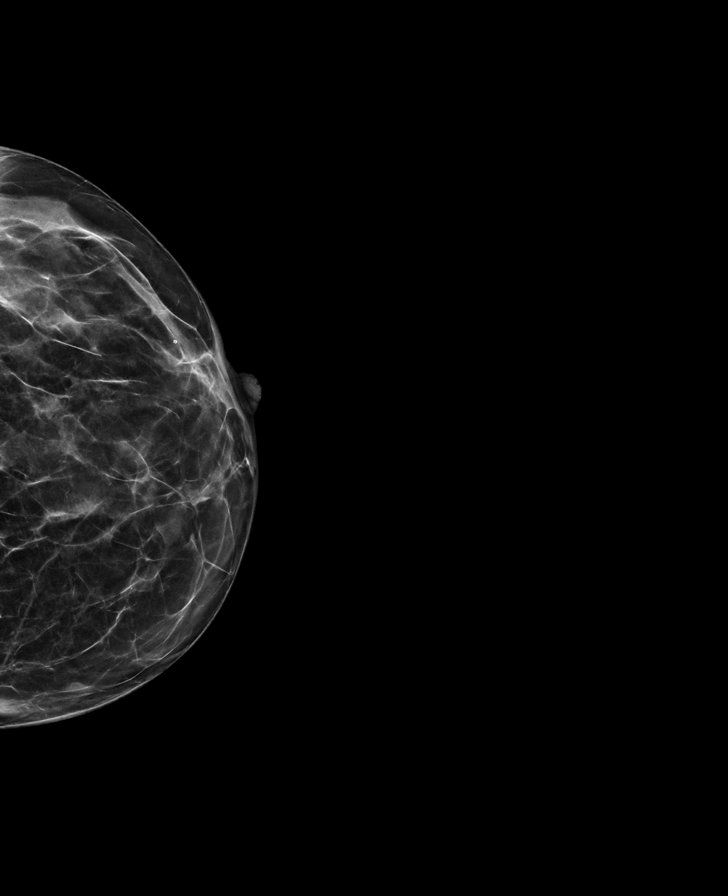

[R MLO (2 of 2)]
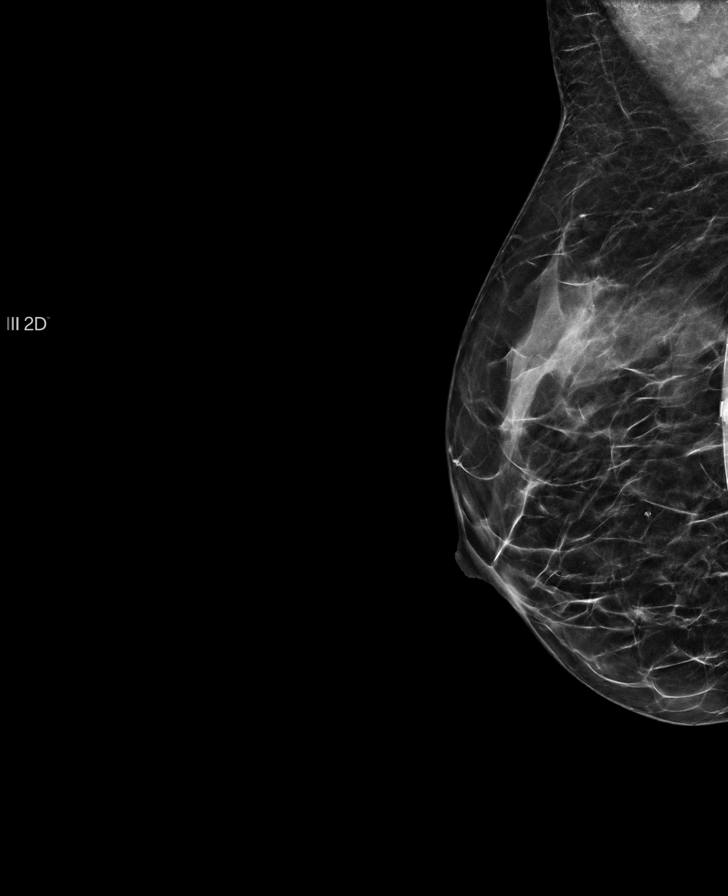

[L MLO (2 of 2)]
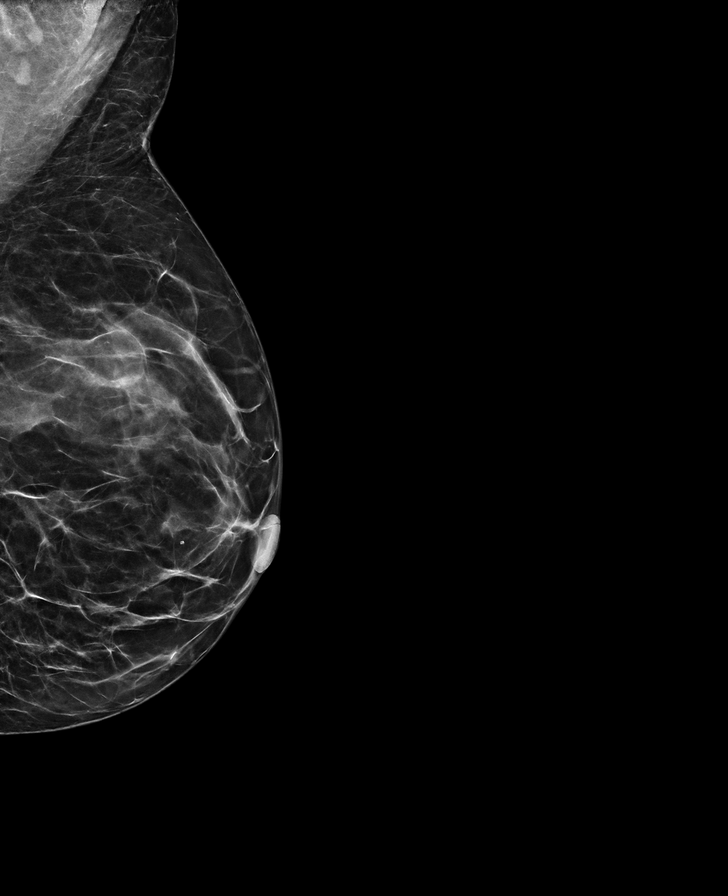

[8 of 28 positions shown; findings below may reference images not displayed]

FINDINGS: There are breast implants in place which limits the sensitivity of 
mammography to detect breast cancer. Benign calcification. No dominant mass or 
suspicious clusters of microcalcifications in the breast
IMPRESSION: Stable mammogram. 
(BI-RADS 2) Benign findings. Routine mammographic follow-up is recommended.

## 2023-03-22 IMAGING — DX CHEST PA AND LATERAL
1 series · 2 of 2 positions shown · non-contrast
Comparison: None.

________________________________________________________________________________________________ 
CLINICAL INDICATION: Other spondylosis, lumbar region.

[Series 1: PA · 0.14mm/px · 2 of 2 slices shown]
[im 1/2]
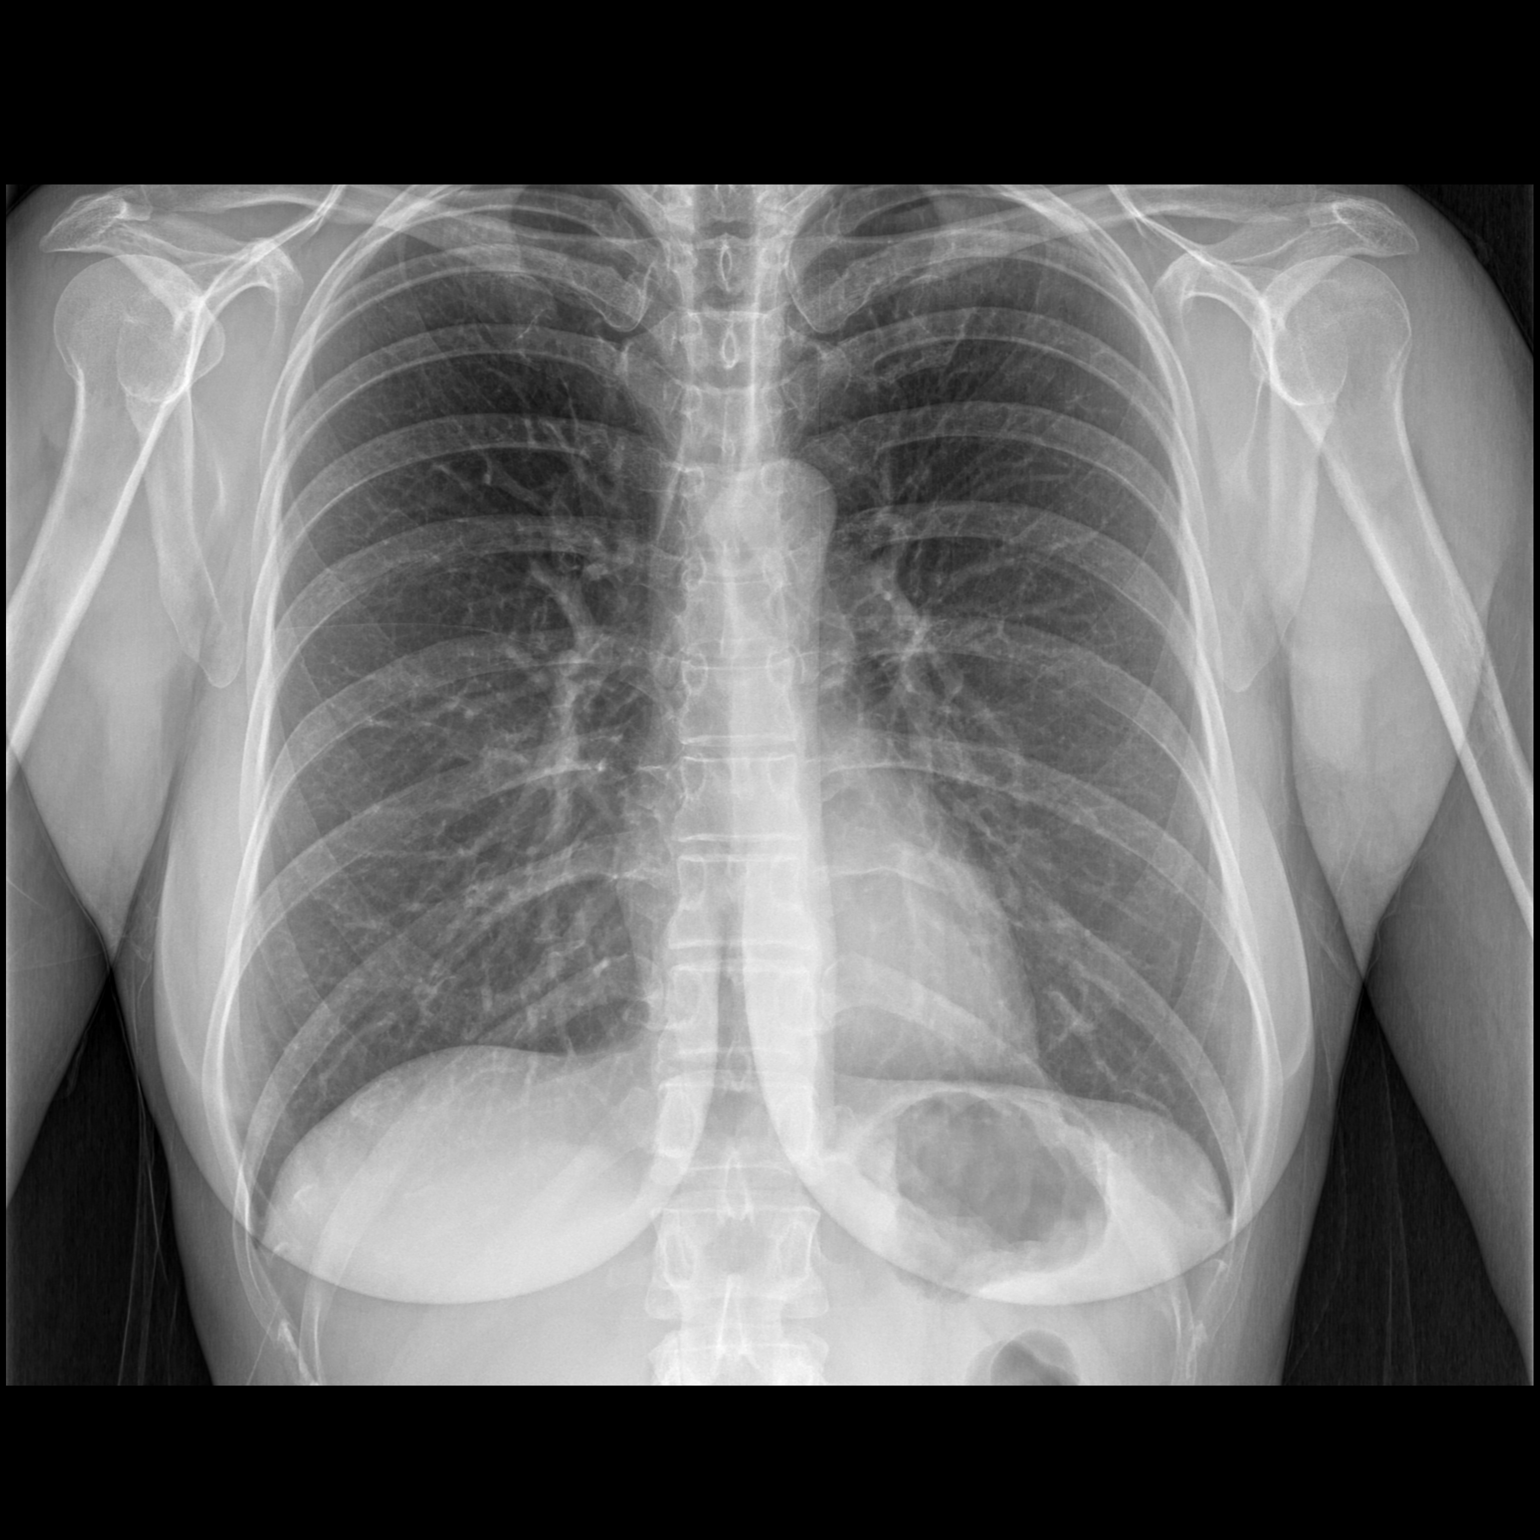
[im 2/2]
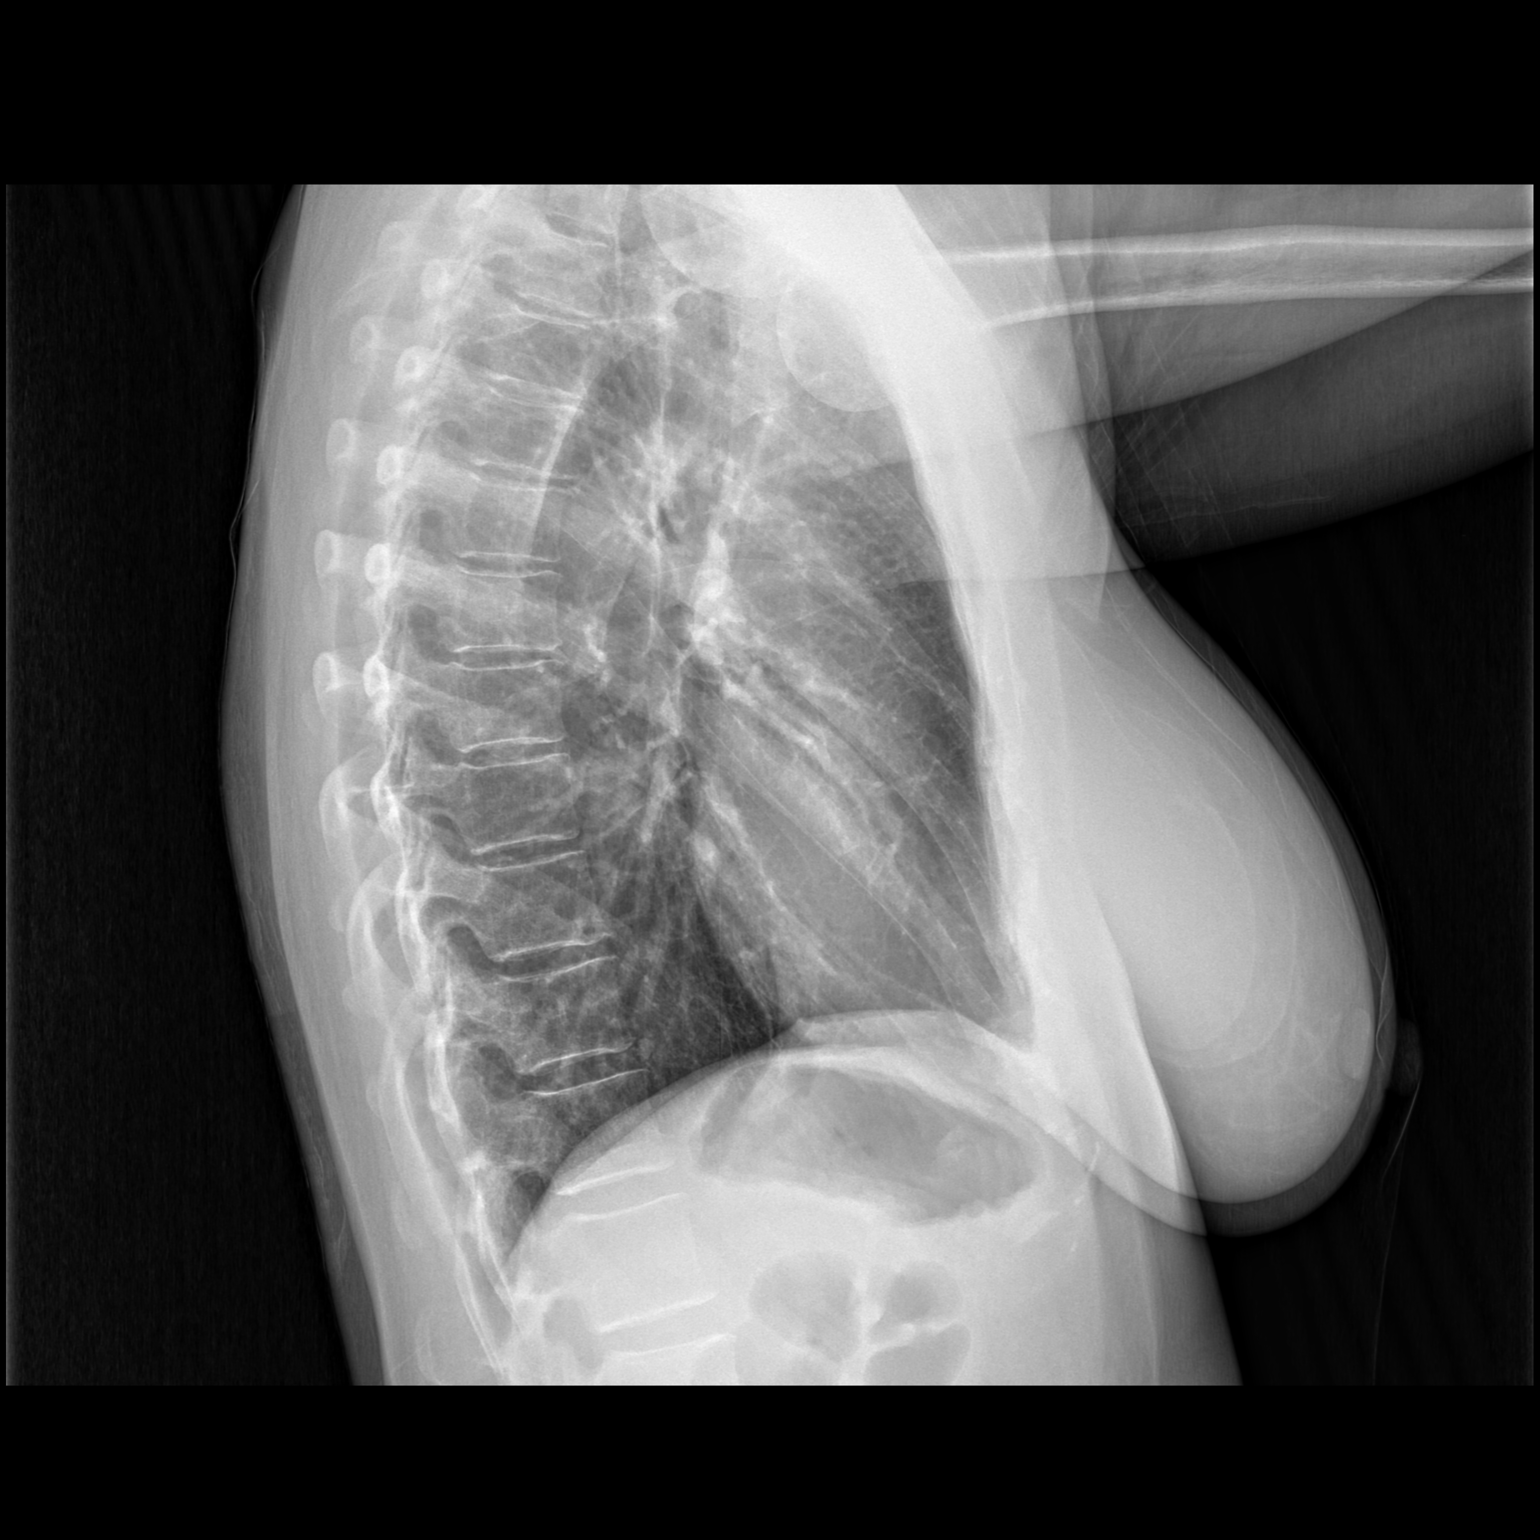

[2 of 2 positions shown; findings below may reference images not displayed]

FINDINGS: Lungs are clear. No consolidation. No effusion. Normal cardiac size 
and pulmonary vascularity. No pneumothorax. No acute osseous abnormality.
IMPRESSION: No acute cardiopulmonary findings.

## 2023-07-11 IMAGING — MR MRI LUMBAR SPINE WITHOUT CONTRAST
7 of 9 series · 16 of 48 positions shown · IV contrast (gadolinium)
Comparison: 11/13/2022 MRI

________________________________________________________________________________________________ 
MRI LUMBAR SPINE WITHOUT CONTRAST, 07/11/2023 [DATE]: 
CLINICAL INDICATION: Low back pain, unspecified. Presurgical evaluation.
TECHNIQUE: Multiplanar, multiecho position MR images of the lumbar spine were 
performed without intravenous gadolinium enhancement. Patient was scanned on a 
1.5T magnet

[Series 101: survey · axial · 10.0mm · 1.25mm/px · z∈[-33,+201]mm · 2 of 10 slices shown]
[im 1/10]
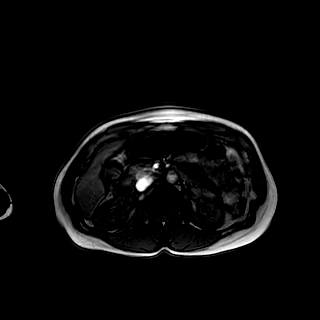
[im 10/10]
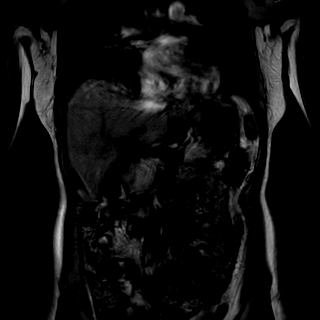

[Series 201: t2w_cor-surv · coronal · 6.0mm · 0.62mm/px · 1 of 10 slices shown]
[im 1/10]
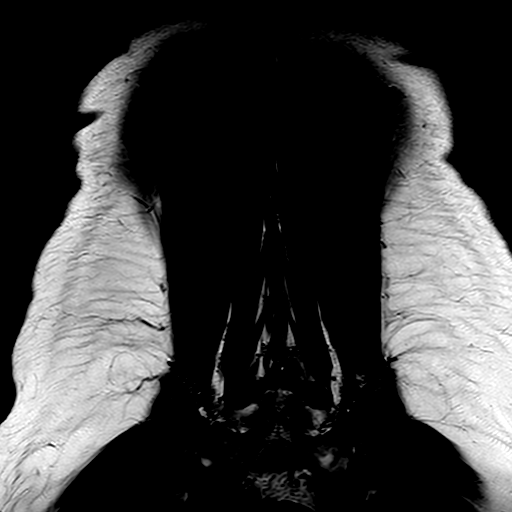

[Series 301: t1_tse_sag · sagittal · 4.0mm · 0.44mm/px · 2 of 19 slices shown]
[im 1/19]
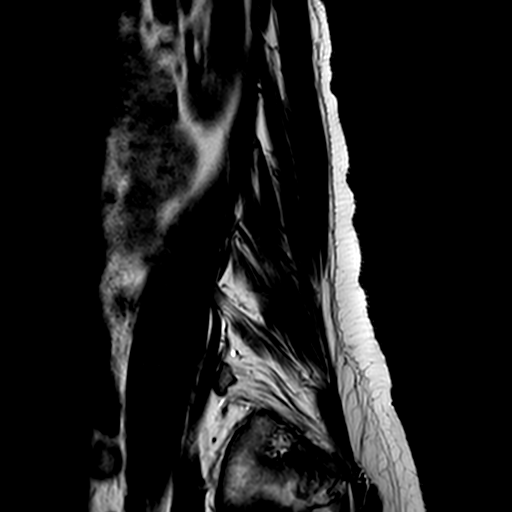
[im 19/19]
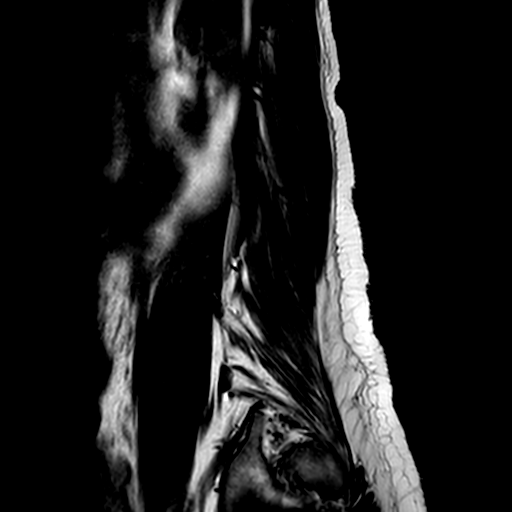

[Series 402: (id)_mdixon_tse · sagittal · 4.0mm · 0.53mm/px · 2 of 19 slices shown]
[im 1/19]
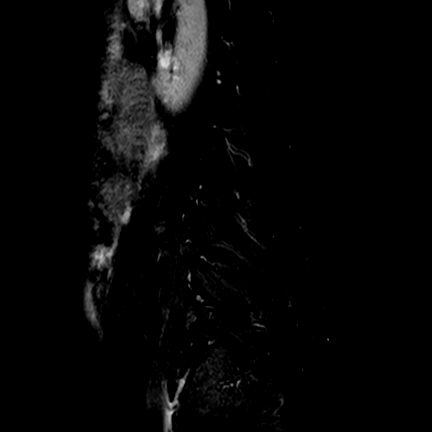
[im 19/19]
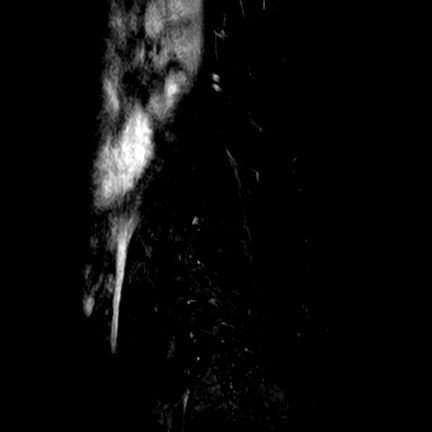

[Series 403: st2w_mdixon_tse · sagittal · 4.0mm · 0.53mm/px · 2 of 19 slices shown]
[im 1/19]
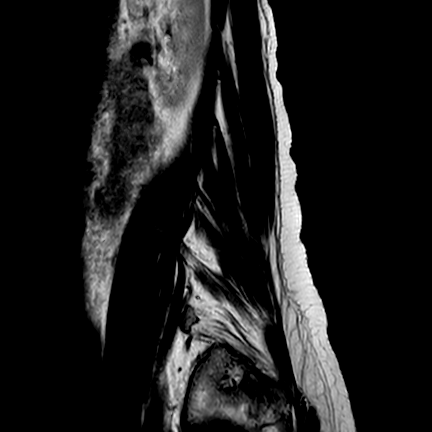
[im 19/19]
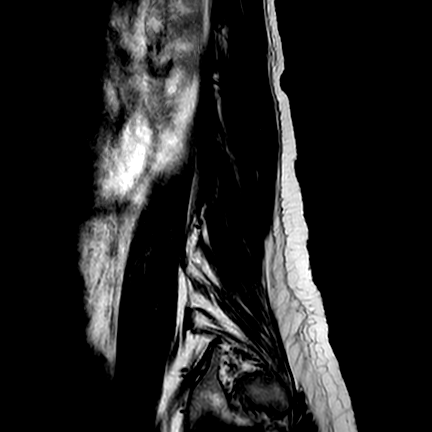

[Series 502: (id) view_ax mpr · axial · 1.0mm · 0.25mm/px · z∈[-173,-134]mm · 2 of 130 slices shown]
[im 9/130]
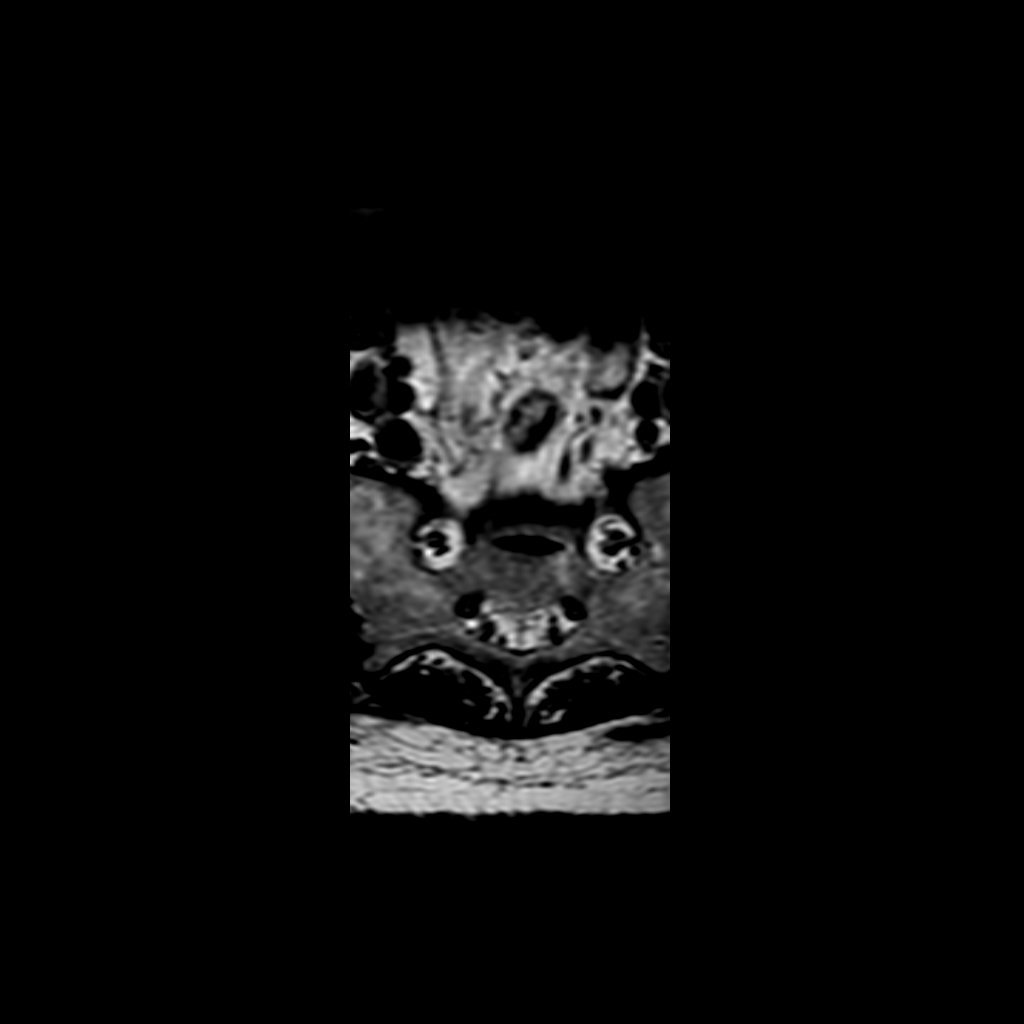
[im 26/130]
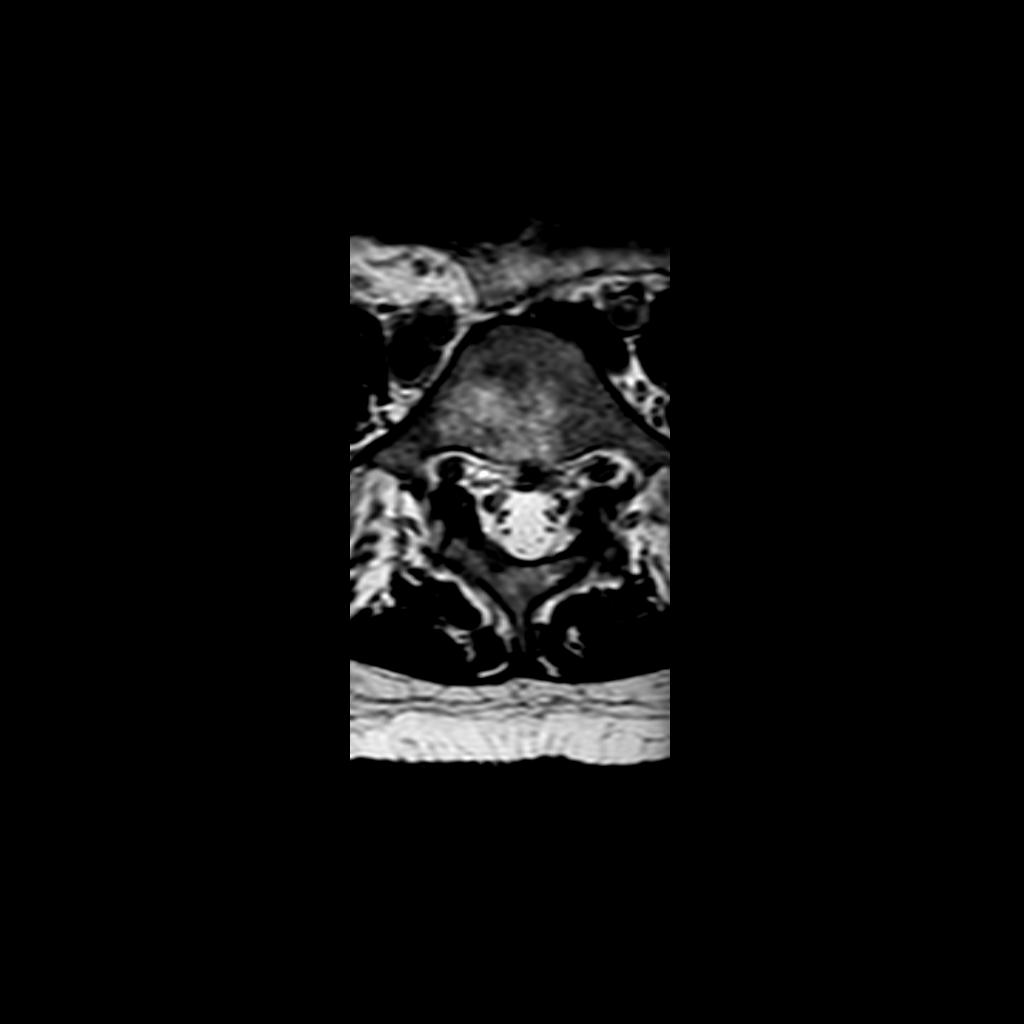

[Series 701: T1 · axial · 4.0mm · 0.39mm/px · z∈[-182,+0]mm · 5 of 42 slices shown]
[im 1/42]
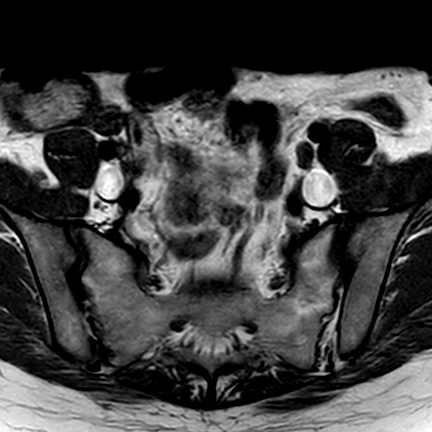
[im 11/42]
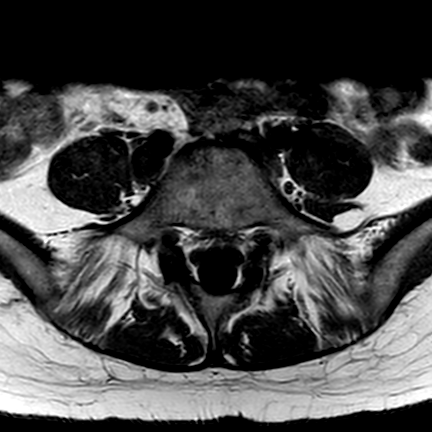
[im 21/42]
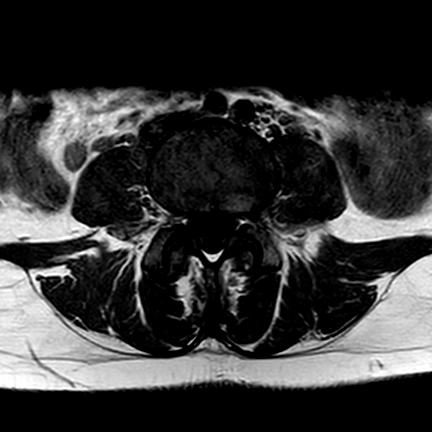
[im 31/42]
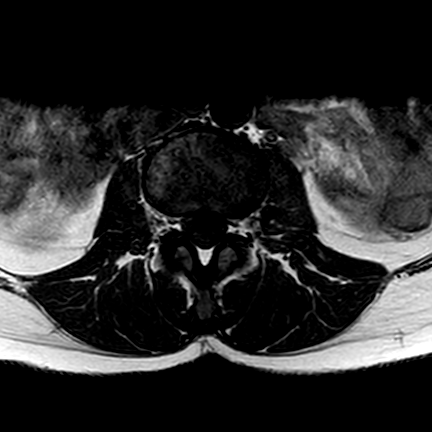
[im 42/42]
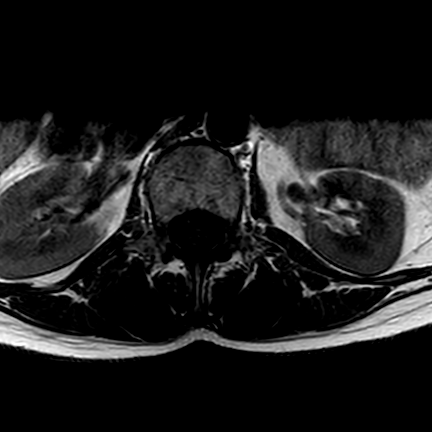

[16 of 48 positions shown; findings below may reference images not displayed]

FINDINGS: GENERAL: 
Nomenclature is based on 5 lumbar type vertebral bodies.     
ALIGNMENT: 0.3 cm anterolisthesis at L5-S1 
VERTEBRAE: L5 pars interarticularis defects. No fracture. Multilevel 
osteophytes.  
MARROW SIGNAL: No focal suspect signal abnormality. 
CORD SIGNAL: Normal distal spinal cord and cauda equina. Conus medullaris 
terminates at L1. 
EXTRASPINAL STRUCTURES: Unremarkable. 
Modic I-II: Type II Modic changes at L5-S1. 
Ligamentum Flavum > 2.5 mm: All levels 
SEGMENTAL: 
T12-L1: Normal disc height and signal. No herniation. Normal facets. No spinal 
canal or neural foraminal stenosis. 
L1-L2: Normal disc height and signal. No herniation. Normal facets. No spinal 
canal or neural foraminal stenosis. 
L2-L3: Normal disc height and signal. No herniation. Normal facets. No spinal 
canal or neural foraminal stenosis. 
L3-L4: Normal disc height and signal. No herniation. Normal facets. No spinal 
canal or neural foraminal stenosis. 
L4-L5: Stable tiny central disc protrusion measures 0.3 cm in AP dimensions and 
0.8 cm in width. Mild disc space narrowing and disc desiccation. Mild facet 
arthropathy. No spinal canal or neural foraminal stenosis. 
L5-S1: Chronic L5 pars interarticularis defects and 0.3 cm anterolisthesis. 
Stable tiny central disc extrusion with superior subligamentous extension 
measures 0.3 cm in AP dimensions and extends 0.5 cm superior to the disc space. 
Mild disc bulge, mild disc space narrowing and disc desiccation. Normal facets. 
No central canal stenosis. Mild neural foraminal stenosis. 
IMPRESSION 
1.  Chronic L5 pars interarticularis defects and L5-S1 grade 1 anterolisthesis, 
tiny central disc extrusion and mild neural foraminal stenosis. 
2.  Stable L4-L5 mild degenerative change and tiny central disc protrusion.

## 2023-07-11 IMAGING — CT CT LUMBAR SPINE WITHOUT CONTRAST
3 series · 11 of 33 positions shown, 13 images · non-contrast
Comparison: 11/28/2022 CT

________________________________________________________________________________________________ 
CT LUMBAR SPINE WITHOUT CONTRAST, 07/11/2023 [DATE]: 
CLINICAL INDICATION: Low back pain, unspecified. Presurgical evaluation.  
A search for DICOM formatted images was conducted for prior CT imaging studies 
completed at a non-affiliated media free facility.
TECHNIQUE: The lumbar spine was scanned from T12 through mid-sacrum without 
contrast on a high-resolution CT scanner using dose reduction techniques. 
Routing MPR reconstructions were performed. Count of known CT and Cardiac 
Nuclear Medicine studies performed in the previous 12 months = 1.

[Series 3: axial st · axial · 0.31mm/px · z∈[-294,-114]mm · 3 of 293 slices shown, 4 images]
[im 68/293  soft-tissue]
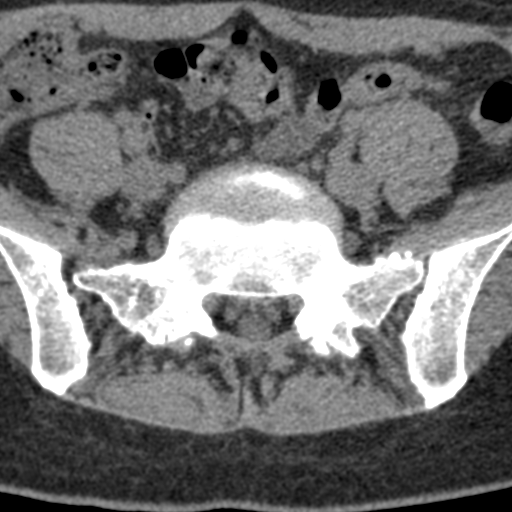
[im 68/293  bone]
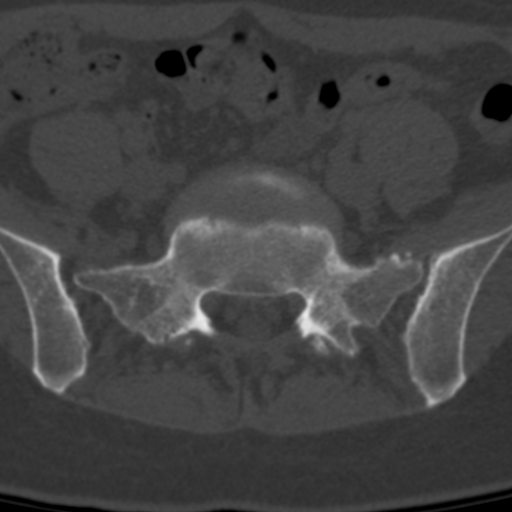
[im 158/293  bone]
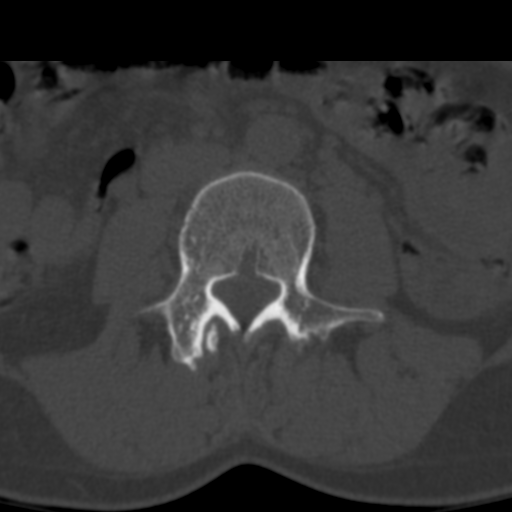
[im 248/293  bone]
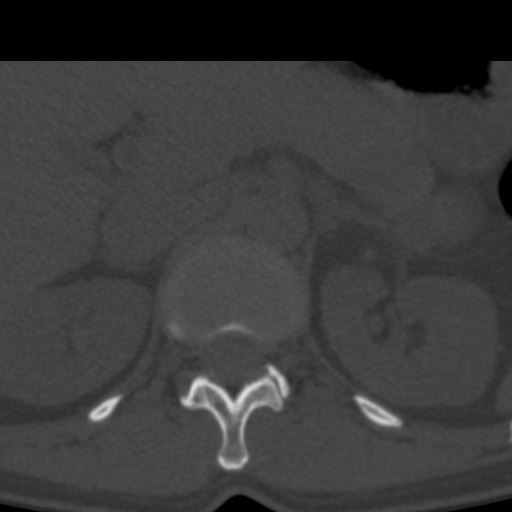

[Series 7: sag st · sagittal · 0.35mm/px · 5 of 147 slices shown, 6 images]
[im 49/147  bone]
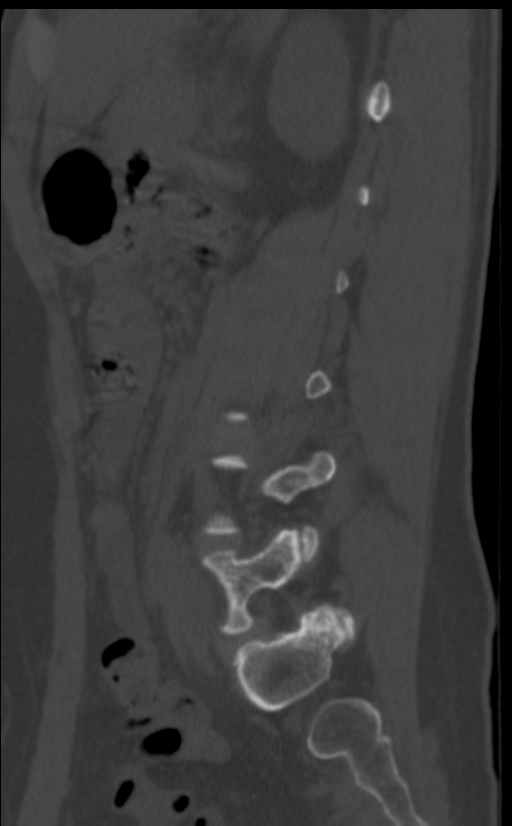
[im 61/147  bone]
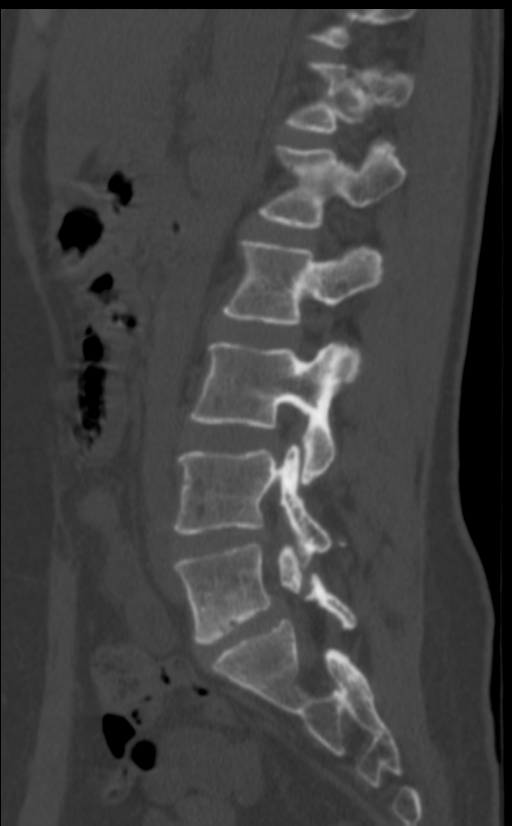
[im 74/147  soft-tissue]
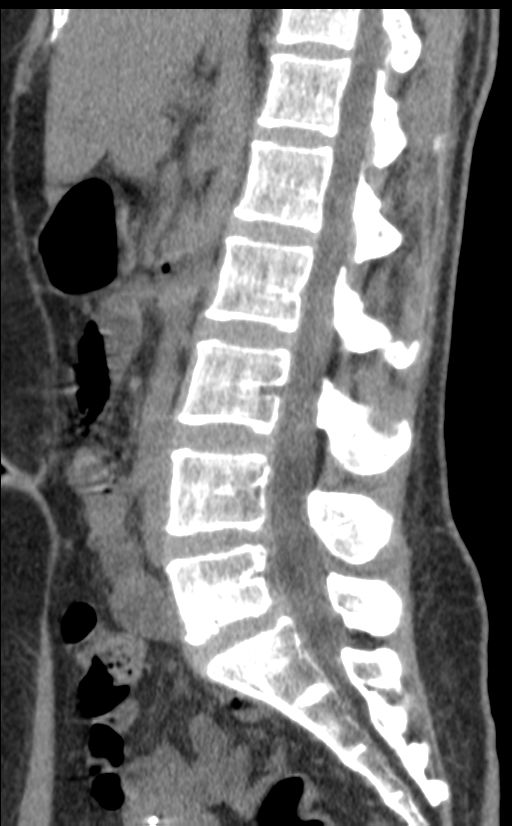
[im 74/147  bone]
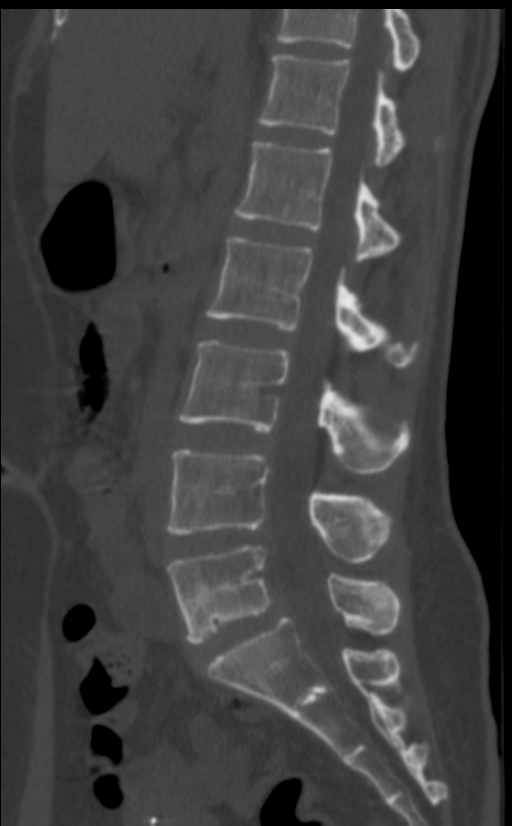
[im 86/147  bone]
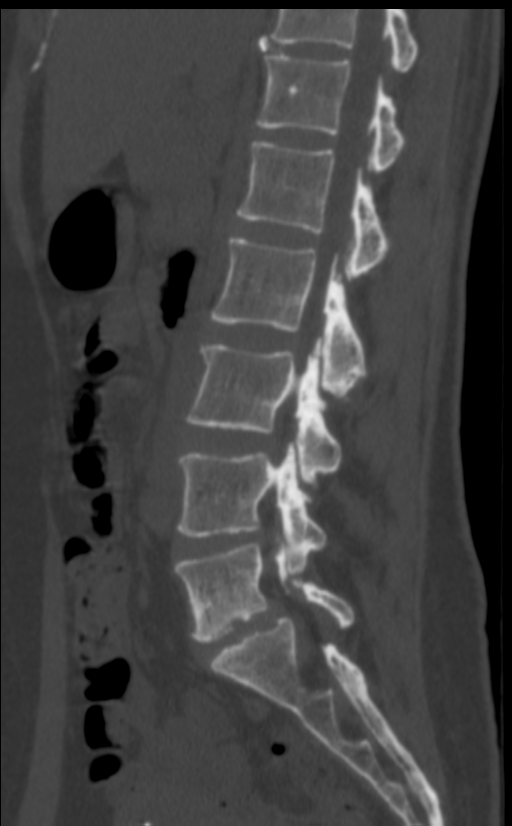
[im 98/147  bone]
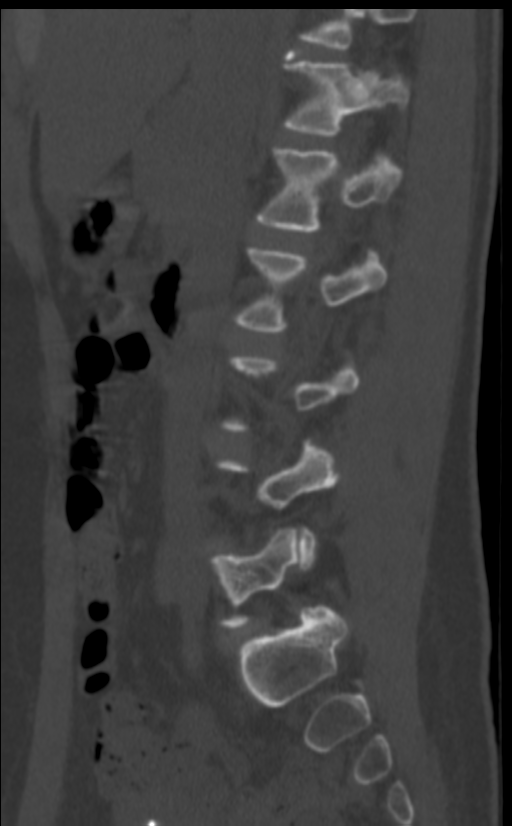

[Series 8: cor st · coronal · 0.36mm/px · 3 of 142 slices shown]
[im 29/142  bone]
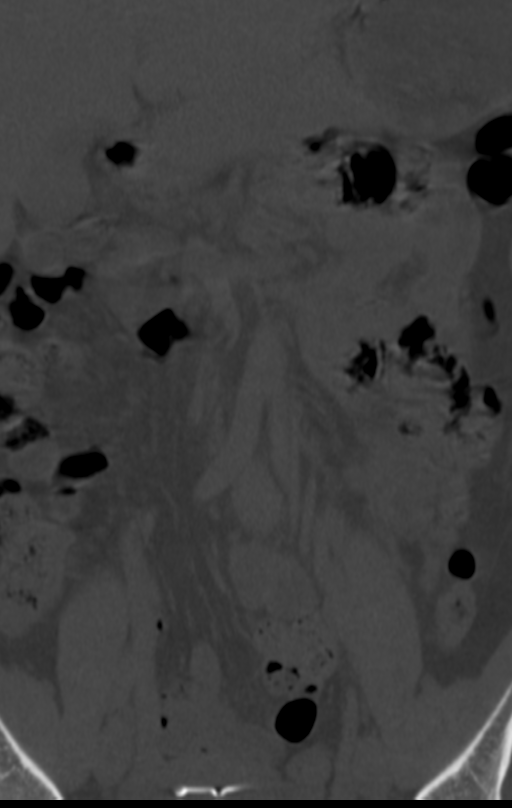
[im 57/142  bone]
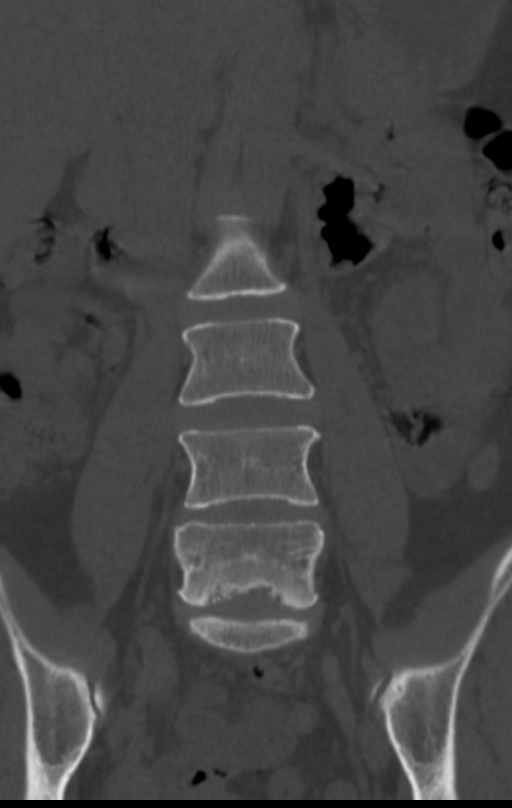
[im 85/142  bone]
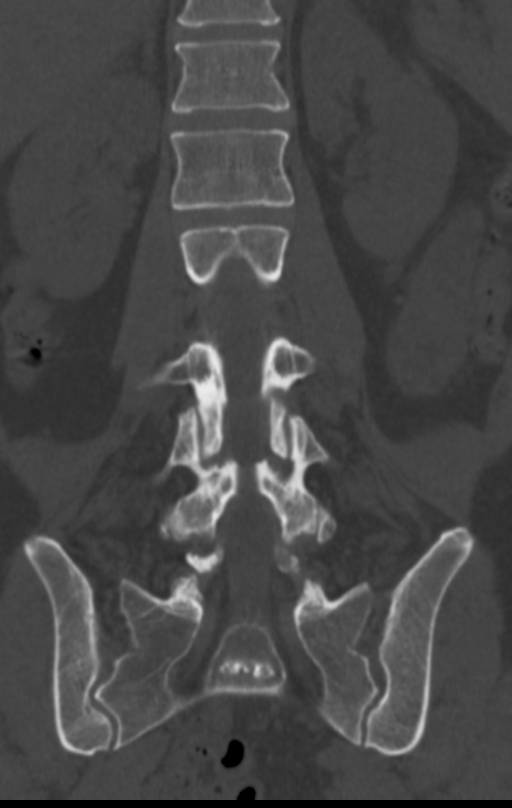

[11 of 33 positions shown; findings below may reference images not displayed]

FINDINGS: Chronic L5 pars interarticularis defects and stable 0.3 cm 
anterolisthesis at L5-S1. Tiny L5 anterior inferior endplate marginal 
osteophytes and inferior endplate subcortical cystic change/irregularity. No 
vertebral body fracture. No osteolytic or osteosclerotic lesion. Included 
portions of the SI joints are negative. The aorta is normal in caliber. 
Paraspinal soft tissues are negative.  
Disc levels are as follows given the intrinsic resolution of the CT exam: 
T12-L1: The disc is normal in height. No discrete disc herniation. Normal 
facets. No spinal canal or neural foraminal stenosis.  
L1-L2: The disc is normal in height. No discrete disc herniation. Normal facets. 
No spinal canal or neural foraminal stenosis.  
L2-L3: The disc is normal in height. No discrete disc herniation. Normal facets. 
No spinal canal or neural foraminal stenosis.  
L3-L4: The disc is normal in height. No discrete disc herniation. Normal facets. 
No spinal canal or neural foraminal stenosis.  
L4-L5: Mild disc space narrowing. No discrete disc herniation. Mild facet 
arthropathy. No spinal canal or neural foraminal stenosis.  
L5-S1: Chronic L5 pars interarticularis defects and stable 0.3 cm 
anterolisthesis at L5-S1. Mild disc space narrowing. No discrete disc 
herniation. Normal facets. No spinal canal or neural foraminal stenosis.
IMPRESSION: 1.  Chronic L5 pars interarticularis defects and L5-S1 grade 1 anterolisthesis 
and mild neural foraminal stenosis. 
2.  Stable L4-L5 mild degenerative change.
